# Patient Record
Sex: Female | Born: 1970 | Hispanic: Yes | Marital: Married | Smoking: Never smoker
Health system: Southern US, Community
[De-identification: ages and names within clinical notes are randomized; demographics above are authoritative.]

## PROBLEM LIST (undated history)

## (undated) DIAGNOSIS — Z915 Personal history of self-harm: Secondary | ICD-10-CM

## (undated) DIAGNOSIS — I10 Essential (primary) hypertension: Secondary | ICD-10-CM

## (undated) DIAGNOSIS — Z9151 Personal history of suicidal behavior: Secondary | ICD-10-CM

---

## 2015-05-21 ENCOUNTER — Encounter (HOSPITAL_COMMUNITY): Payer: Self-pay | Admitting: Nurse Practitioner

## 2015-05-21 ENCOUNTER — Encounter (HOSPITAL_COMMUNITY): Payer: Self-pay | Admitting: *Deleted

## 2015-05-21 ENCOUNTER — Emergency Department (HOSPITAL_COMMUNITY)
Admission: EM | Admit: 2015-05-21 | Discharge: 2015-05-21 | Disposition: A | Discharge status: Other Acute Inpatient Hospital | Payer: BLUE CROSS/BLUE SHIELD | Attending: Emergency Medicine | Admitting: Emergency Medicine

## 2015-05-21 ENCOUNTER — Observation Stay (HOSPITAL_COMMUNITY)
Admission: AD | Admit: 2015-05-21 | Discharge: 2015-05-22 | Disposition: A | Discharge status: Home / Self Care | Payer: BLUE CROSS/BLUE SHIELD | Source: Intra-hospital | Attending: Psychiatry | Admitting: Psychiatry

## 2015-05-21 DIAGNOSIS — S61512A Laceration without foreign body of left wrist, initial encounter: Secondary | ICD-10-CM | POA: Insufficient documentation

## 2015-05-21 DIAGNOSIS — S1091XA Abrasion of unspecified part of neck, initial encounter: Secondary | ICD-10-CM | POA: Insufficient documentation

## 2015-05-21 DIAGNOSIS — Z880 Allergy status to penicillin: Secondary | ICD-10-CM | POA: Insufficient documentation

## 2015-05-21 DIAGNOSIS — F329 Major depressive disorder, single episode, unspecified: Secondary | ICD-10-CM | POA: Insufficient documentation

## 2015-05-21 DIAGNOSIS — F321 Major depressive disorder, single episode, moderate: Secondary | ICD-10-CM | POA: Diagnosis present

## 2015-05-21 DIAGNOSIS — I10 Essential (primary) hypertension: Secondary | ICD-10-CM | POA: Insufficient documentation

## 2015-05-21 DIAGNOSIS — F1410 Cocaine abuse, uncomplicated: Secondary | ICD-10-CM | POA: Insufficient documentation

## 2015-05-21 DIAGNOSIS — X788XXA Intentional self-harm by other sharp object, initial encounter: Secondary | ICD-10-CM | POA: Diagnosis not present

## 2015-05-21 DIAGNOSIS — R45851 Suicidal ideations: Secondary | ICD-10-CM

## 2015-05-21 DIAGNOSIS — Y9389 Activity, other specified: Secondary | ICD-10-CM | POA: Diagnosis not present

## 2015-05-21 DIAGNOSIS — Z3202 Encounter for pregnancy test, result negative: Secondary | ICD-10-CM | POA: Insufficient documentation

## 2015-05-21 DIAGNOSIS — Y9289 Other specified places as the place of occurrence of the external cause: Secondary | ICD-10-CM | POA: Insufficient documentation

## 2015-05-21 DIAGNOSIS — Y998 Other external cause status: Secondary | ICD-10-CM | POA: Insufficient documentation

## 2015-05-21 DIAGNOSIS — F411 Generalized anxiety disorder: Secondary | ICD-10-CM | POA: Diagnosis not present

## 2015-05-21 DIAGNOSIS — F332 Major depressive disorder, recurrent severe without psychotic features: Secondary | ICD-10-CM | POA: Diagnosis present

## 2015-05-21 HISTORY — DX: Personal history of self-harm: Z91.5

## 2015-05-21 HISTORY — DX: Personal history of suicidal behavior: Z91.51

## 2015-05-21 HISTORY — DX: Essential (primary) hypertension: I10

## 2015-05-21 LAB — COMPREHENSIVE METABOLIC PANEL
ALT: 18 U/L (ref 14–54)
AST: 22 U/L (ref 15–41)
Albumin: 3.7 g/dL (ref 3.5–5.0)
Alkaline Phosphatase: 68 U/L (ref 38–126)
Anion gap: 15 (ref 5–15)
BUN: 14 mg/dL (ref 6–20)
CHLORIDE: 106 mmol/L (ref 101–111)
CO2: 19 mmol/L — ABNORMAL LOW (ref 22–32)
Calcium: 9.7 mg/dL (ref 8.9–10.3)
Creatinine, Ser: 0.69 mg/dL (ref 0.44–1.00)
GFR calc non Af Amer: >60 mL/min (ref >60)
Glucose, Bld: 108 mg/dL — ABNORMAL HIGH (ref 65–99)
POTASSIUM: 4.2 mmol/L (ref 3.5–5.1)
SODIUM: 140 mmol/L (ref 135–145)
Total Bilirubin: 0.5 mg/dL (ref 0.3–1.2)
Total Protein: 6.8 g/dL (ref 6.5–8.1)

## 2015-05-21 LAB — RAPID URINE DRUG SCREEN, HOSP PERFORMED
AMPHETAMINES: NOT DETECTED
BENZODIAZEPINES: NOT DETECTED
Barbiturates: NOT DETECTED
COCAINE: POSITIVE — AB
OPIATES: NOT DETECTED
Tetrahydrocannabinol: NOT DETECTED

## 2015-05-21 LAB — CBC
HCT: 33.5 % — ABNORMAL LOW (ref 36.0–46.0)
Hemoglobin: 9.7 g/dL — ABNORMAL LOW (ref 12.0–15.0)
MCH: 19.5 pg — ABNORMAL LOW (ref 26.0–34.0)
MCHC: 29 g/dL — ABNORMAL LOW (ref 30.0–36.0)
MCV: 67.3 fL — ABNORMAL LOW (ref 78.0–100.0)
PLATELETS: 503 10*3/uL — AB (ref 150–400)
RBC: 4.98 MIL/uL (ref 3.87–5.11)
RDW: 18.8 % — ABNORMAL HIGH (ref 11.5–15.5)
WBC: 9 10*3/uL (ref 4.0–10.5)

## 2015-05-21 LAB — I-STAT BETA HCG BLOOD, ED (MC, WL, AP ONLY): I-stat hCG, quantitative: <5 m[IU]/mL (ref <5)

## 2015-05-21 LAB — ACETAMINOPHEN LEVEL

## 2015-05-21 LAB — SALICYLATE LEVEL

## 2015-05-21 LAB — ETHANOL

## 2015-05-21 MED ORDER — HYDROXYZINE HCL 25 MG PO TABS
25.0000 mg | ORAL_TABLET | Freq: Three times a day (TID) | ORAL | Status: DC | PRN
  Administered 2015-05-21: 25 mg via ORAL
  Filled 2015-05-21: qty 1

## 2015-05-21 MED ORDER — HYDROCHLOROTHIAZIDE 25 MG PO TABS
25.0000 mg | ORAL_TABLET | Freq: Every day | ORAL | Status: DC
  Administered 2015-05-21: 25 mg via ORAL
  Filled 2015-05-21: qty 1

## 2015-05-21 MED ORDER — TRAZODONE HCL 50 MG PO TABS
50.0000 mg | ORAL_TABLET | Freq: Every evening | ORAL | Status: DC | PRN
  Administered 2015-05-21: 50 mg via ORAL
  Filled 2015-05-21: qty 1

## 2015-05-21 MED ORDER — ZOLPIDEM TARTRATE 5 MG PO TABS: 5.0000 mg | ORAL_TABLET | Freq: Every evening | ORAL | Status: DC | PRN

## 2015-05-21 MED ORDER — ALUM & MAG HYDROXIDE-SIMETH 200-200-20 MG/5ML PO SUSP: 30.0000 mL | ORAL | Status: DC | PRN

## 2015-05-21 MED ORDER — ACETAMINOPHEN 325 MG PO TABS: 650.0000 mg | ORAL_TABLET | ORAL | Status: DC | PRN

## 2015-05-21 MED ORDER — LORAZEPAM 1 MG PO TABS: 1.0000 mg | ORAL_TABLET | Freq: Three times a day (TID) | ORAL | Status: DC | PRN

## 2015-05-21 MED ORDER — IBUPROFEN 400 MG PO TABS: 600.0000 mg | ORAL_TABLET | Freq: Three times a day (TID) | ORAL | Status: DC | PRN

## 2015-05-21 MED ORDER — ACETAMINOPHEN 325 MG PO TABS: 650.0000 mg | ORAL_TABLET | Freq: Four times a day (QID) | ORAL | Status: DC | PRN

## 2015-05-21 MED ORDER — HYDROCHLOROTHIAZIDE 25 MG PO TABS
25.0000 mg | ORAL_TABLET | Freq: Every day | ORAL | Status: DC
  Administered 2015-05-22: 25 mg via ORAL
  Filled 2015-05-21: qty 1

## 2015-05-21 MED ORDER — ONDANSETRON HCL 4 MG PO TABS: 4.0000 mg | ORAL_TABLET | Freq: Three times a day (TID) | ORAL | Status: DC | PRN

## 2015-05-21 MED ORDER — MAGNESIUM HYDROXIDE 400 MG/5ML PO SUSP: 30.0000 mL | Freq: Every day | ORAL | Status: DC | PRN

## 2015-05-21 NOTE — ED Notes (Signed)
Paige, [facility] - advised interpretor for pt should be arriving in approx 45 min.

## 2015-05-21 NOTE — ED Notes (Signed)
She reports 1 month history of depression and suicidal thoughts. She has a plan to hurt herself which she did not tell me. She denies HI or thoughts of harming others. She reports this all started when she began to have marital problems 1 month ago. She denies any past history of depression or suicidality. She reports occasional alcohol use but denies any heavy drinking or other substance use.

## 2015-05-21 NOTE — ED Notes (Addendum)
Pt ambulatory to C21 w/daughter. Pt wearing maroon-colored paper scrubs. Pt noted to be tearful. Min English speaking. Pt and daughter aware [facility] will perform TTS when interpretor arrives. Both voiced understanding of Pod C policies. Pt given crackers, graham crackers, peanut butter and water as requested d/t does not like food on meal tray. Staffing Office aware pt is now in C21.

## 2015-05-21 NOTE — ED Notes (Addendum)
TC from [place], interpreter. She will get to [facility] in approx 45 mins. Writer notified pt's Child psychotherapist.  Evette Cristal, [place] Therapeutic Triage Specialist [phone]   Writer spoke w/ Darel Hong at [employer] [phone]. Writer requests a Spanish interpreter to be present at bedside when Multimedia programmer. Darel Hong will call writer back when interpreter on way to [facility] ED. Writer notified pt's RN [place] [phone]  Evette Cristal, [place] Therapeutic Triage Specialist [phone]
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
## 2015-05-21 NOTE — ED Notes (Signed)
Notified Public house manager. Notified Security for [employer]

## 2015-05-21 NOTE — ED Provider Notes (Signed)
Accepted to [facility] by Dr. Lucianne Muss. Stable and medically cleared for transfer.  Lavera Guise, MD 05/21/15 [phone]
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
## 2015-05-21 NOTE — ED Notes (Signed)
Pt accepted to [facility] OBS unit. Per Darel Hong at [employer], they don't have contract with [facility] so can't provide service for pt at OBS. TTS will need to contact [employer] in am.   Evette Cristal, [place] Therapeutic Triage Specialist

## 2015-05-21 NOTE — ED Notes (Signed)
Pt and daughter lying on bed, alert. Daughter voiced understanding that she may stay until TTS is completed and disposition is set.

## 2015-05-21 NOTE — ED Notes (Signed)
Pt's daughter Brigid Re [phone]. Please keep daughter informed when pt is to leave OBS as she is pt's ride.  Evette Cristal, [place] Therapeutic Triage Specialist
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
## 2015-05-21 NOTE — ED Notes (Signed)
Dinner ordered for pt

## 2015-05-21 NOTE — ED Notes (Signed)
TTS completed. RN spoke w/pt via Carie Caddy. Pt voiced understanding will wait for disposition from [facility]. Pt advised she is concerned d/t was advised she was positive for cocaine in her UDS and states she has not ever used any drugs. 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 Pt's daughter at bedside.

## 2015-05-21 NOTE — ED Notes (Signed)
Per Idalia Needle, [facility] - pt accepted to [facility] Obs - Dr Lucianne Muss - may call report/transport pt at 1930. Paige speaking w/pt's daughter.

## 2015-05-21 NOTE — ED Notes (Signed)
Writer called again spoke w/ Darel Hong at [employer] [phone]. Writer asked if Darel Hong could find out how much longer it will take Nettie Elm to arrive at [facility] as Clinical research associate still waiting to conduct pt's teleassessment consult.   Evette Cristal, [place] Therapeutic Triage Specialist

## 2015-05-21 NOTE — ED Notes (Signed)
Copy of consent forms faxed to [facility], copy sent to medical records and original to [facility].

## 2015-05-21 NOTE — ED Notes (Signed)
Pt signing consent forms - daughter assisting w/interpreting per pt's request - offered to call for interpretor - declined - advised her daughter can assist.

## 2015-05-22 DIAGNOSIS — F332 Major depressive disorder, recurrent severe without psychotic features: Secondary | ICD-10-CM | POA: Diagnosis present

## 2015-05-22 DIAGNOSIS — F321 Major depressive disorder, single episode, moderate: Secondary | ICD-10-CM | POA: Diagnosis present

## 2015-05-22 MED ORDER — SERTRALINE HCL 50 MG PO TABS: 50.0000 mg | ORAL_TABLET | Freq: Every day | ORAL | Status: AC

## 2015-05-22 MED ORDER — HYDROXYZINE HCL 25 MG PO TABS: 25.0000 mg | ORAL_TABLET | Freq: Three times a day (TID) | ORAL | Status: DC | PRN

## 2015-05-22 MED ORDER — SERTRALINE HCL 50 MG PO TABS
50.0000 mg | ORAL_TABLET | Freq: Every day | ORAL | Status: DC
  Administered 2015-05-22: 50 mg via ORAL
  Filled 2015-05-22: qty 1

## 2015-05-22 MED ORDER — TRAZODONE HCL 50 MG PO TABS: 50.0000 mg | ORAL_TABLET | Freq: Every evening | ORAL | Status: AC | PRN

## 2015-05-22 NOTE — BH Assessment (Addendum)
[facility] Assessment Progress Note  Patient was seen this date by Porfirio Oar and Earlene Plater NP. This Clinical research associate utilized a Research officer, trade union to assist with patient's treatment plan/concerns. Patient stated that she was still feeling "sad" this date but denied any S/I. Collateral was gathered from patient's daughter who stated that her mother and father had been having marital problems for the last two weeks with patient becoming upset last night and made statements that she may harm herself. Patient through interpreter, stated she would like to return to her residence this date and was just "upset" last night. Patient did report increased stress and would be interested in starting on some medications to assist with anxiety and depression. Patient per Earlene Plater NP will be started on medication/s and monitored in the Observational Unit. Patient will be re-evaluated later this date to determine disposition. This Clinical research associate contacted the [employer] to assist patient with Spanish referral services/counseling on discharge. Patient is to contact [phone]. [facility] [place] also offers services to individuals that are bi-lingual (Spanish) and will assist with follow up for medication management.

## 2015-05-22 NOTE — Progress Notes (Signed)
[place] Unit Progress Note  05/22/2015 4:24 PM Jillian Maldonado  MRN:  [id number] Subjective:    Patient states "I am still very sad. Not wanting to hurt myself right now. I am having relationship problems right now."  Objective:  Patient seen and chart is reviewed. The patient reports ongoing depressive symptoms and stressors surrounding relationship with husband. Her daughter reported that the patient has not had previous psychiatric treatment. The patient reported on admission that she had recently told her husband something about her past which resulted in him acting differently towards her. This coincides with her recent problems with depression and suicidal thoughts. Patient continues to deny any psychotic symptoms or past manic episodes. She is motivated to pursue counseling and open to trying medication to target her current symptoms.   Principal Problem: MDD (major depressive disorder), single episode, moderate ([place]) Diagnosis:   Patient Active Problem List   Diagnosis Date Noted  . MDD (major depressive disorder), single episode, moderate ([place]) [F32.1] 05/22/2015   Total Time spent with patient: 30 minutes  Past Psychiatric History: Depression  Past Medical History:  Past Medical History  Diagnosis Date  . Hypertension   . H/O suicide attempt    History reviewed. No pertinent past surgical history. Family History: History reviewed. No pertinent family history. Family Psychiatric  History: None reported  Social History:  History  Alcohol Use  . Yes     History  Drug Use No    Social History   Social History  . Marital Status: Married    Spouse Name: N/A  . Number of Children: N/A  . Years of Education: N/A   Social History Main Topics  . Smoking status: Never Smoker   . Smokeless tobacco: None  . Alcohol Use: Yes  . Drug Use: No  . Sexual Activity: Not Asked   Other Topics Concern  . None   Social History Narrative   Additional Social History:                          Sleep: Fair  Appetite:  Fair  Current Medications: Current Facility-Administered Medications  Medication Dose Route Frequency Provider Last Rate Last Dose  . acetaminophen (TYLENOL) tablet 650 mg  650 mg Oral Q6H PRN Harriet Butte, NP      . alum & mag hydroxide-simeth (MAALOX/MYLANTA) 200-200-20 MG/5ML suspension 30 mL  30 mL Oral Q4H PRN Harriet Butte, NP      . hydrochlorothiazide (HYDRODIURIL) tablet 25 mg  25 mg Oral Daily Harriet Butte, NP   25 mg at 05/22/15 0744  . hydrOXYzine (ATARAX/VISTARIL) tablet 25 mg  25 mg Oral TID PRN Harriet Butte, NP   25 mg at 05/21/15 2216  . magnesium hydroxide (MILK OF MAGNESIA) suspension 30 mL  30 mL Oral Daily PRN Harriet Butte, NP      . sertraline (ZOLOFT) tablet 50 mg  50 mg Oral Daily Niel Hummer, NP   50 mg at 05/22/15 1200  . traZODone (DESYREL) tablet 50 mg  50 mg Oral QHS PRN Harriet Butte, NP   50 mg at 05/21/15 2216    Lab Results:  Results for orders placed or performed during the hospital encounter of 05/21/15 (from the past 48 hour(s))  Comprehensive metabolic panel     Status: Abnormal   Collection Time: 05/21/15 12:40 PM  Result Value Ref Range   Sodium 140 135 - 145 mmol/L   Potassium  4.2 3.5 - 5.1 mmol/L   Chloride 106 101 - 111 mmol/L   CO2 19 (L) 22 - 32 mmol/L   Glucose, Bld 108 (H) 65 - 99 mg/dL   BUN 14 6 - 20 mg/dL   Creatinine, Ser 0.69 0.44 - 1.00 mg/dL   Calcium 9.7 8.9 - 10.3 mg/dL   Total Protein 6.8 6.5 - 8.1 g/dL   Albumin 3.7 3.5 - 5.0 g/dL   AST 22 15 - 41 U/L   ALT 18 14 - 54 U/L   Alkaline Phosphatase 68 38 - 126 U/L   Total Bilirubin 0.5 0.3 - 1.2 mg/dL   GFR calc non Af Amer >60 >60 mL/min   GFR calc Af Amer >60 >60 mL/min    Comment: (NOTE) The eGFR has been calculated using the CKD EPI equation. This calculation has not been validated in all clinical situations. eGFR's persistently <60 mL/min signify possible Chronic Kidney Disease.    Anion  gap 15 5 - 15  Ethanol (ETOH)     Status: None   Collection Time: 05/21/15 12:40 PM  Result Value Ref Range   Alcohol, Ethyl (B) <5 <5 mg/dL    Comment:        LOWEST DETECTABLE LIMIT FOR SERUM ALCOHOL IS 5 mg/dL FOR MEDICAL PURPOSES ONLY   Salicylate level     Status: None   Collection Time: 05/21/15 12:40 PM  Result Value Ref Range   Salicylate Lvl <8.6 2.8 - 30.0 mg/dL  Acetaminophen level     Status: Abnormal   Collection Time: 05/21/15 12:40 PM  Result Value Ref Range   Acetaminophen (Tylenol), Serum <10 (L) 10 - 30 ug/mL    Comment:        THERAPEUTIC CONCENTRATIONS VARY SIGNIFICANTLY. A RANGE OF 10-30 ug/mL MAY BE AN EFFECTIVE CONCENTRATION FOR MANY PATIENTS. HOWEVER, SOME ARE BEST TREATED AT CONCENTRATIONS OUTSIDE THIS RANGE. ACETAMINOPHEN CONCENTRATIONS >150 ug/mL AT 4 HOURS AFTER INGESTION AND >50 ug/mL AT 12 HOURS AFTER INGESTION ARE OFTEN ASSOCIATED WITH TOXIC REACTIONS.   CBC     Status: Abnormal   Collection Time: 05/21/15 12:40 PM  Result Value Ref Range   WBC 9.0 4.0 - 10.5 K/uL   RBC 4.98 3.87 - 5.11 MIL/uL   Hemoglobin 9.7 (L) 12.0 - 15.0 g/dL   HCT 33.5 (L) 36.0 - 46.0 %   MCV 67.3 (L) 78.0 - 100.0 fL   MCH 19.5 (L) 26.0 - 34.0 pg   MCHC 29.0 (L) 30.0 - 36.0 g/dL   RDW 18.8 (H) 11.5 - 15.5 %   Platelets 503 (H) 150 - 400 K/uL  I-Stat beta hCG blood, ED ([facility], [facility], [facility] only)     Status: None   Collection Time: 05/21/15 12:51 PM  Result Value Ref Range   I-stat hCG, quantitative <5.0 <5 mIU/mL   Comment 3            Comment:   GEST. AGE      CONC.  (mIU/mL)   <=1 WEEK        5 - 50     2 WEEKS       50 - 500     3 WEEKS       100 - 10,000     4 WEEKS     1,000 - 30,000        FEMALE AND NON-PREGNANT FEMALE:     LESS THAN 5 mIU/mL   Urine rapid drug screen (hosp performed) (Not at [facility])  Status: Abnormal   Collection Time: 05/21/15  1:40 PM  Result Value Ref Range   Opiates NONE DETECTED NONE DETECTED   Cocaine POSITIVE (A) NONE DETECTED    Benzodiazepines NONE DETECTED NONE DETECTED   Amphetamines NONE DETECTED NONE DETECTED   Tetrahydrocannabinol NONE DETECTED NONE DETECTED   Barbiturates NONE DETECTED NONE DETECTED    Comment:        DRUG SCREEN FOR MEDICAL PURPOSES ONLY.  IF CONFIRMATION IS NEEDED FOR ANY PURPOSE, NOTIFY LAB WITHIN 5 DAYS.        LOWEST DETECTABLE LIMITS FOR URINE DRUG SCREEN Drug Class       Cutoff (ng/mL) Amphetamine      1000 Barbiturate      200 Benzodiazepine   825 Tricyclics       053 Opiates          300 Cocaine          300 THC              50     Blood Alcohol level:  Lab Results  Component Value Date   ETH <5 05/21/2015    Physical Findings: AIMS:  , ,  ,  ,    CIWA:    COWS:     Musculoskeletal: Strength & Muscle Tone: within normal limits Gait & Station: normal Patient leans: N/A  Psychiatric Specialty Exam: Review of Systems  Constitutional: Negative.   HENT: Negative.   Eyes: Negative.   Respiratory: Negative.   Cardiovascular: Negative.   Gastrointestinal: Negative.   Genitourinary: Negative.   Musculoskeletal: Negative.   Skin: Negative.   Neurological: Negative.   Endo/Heme/Allergies: Negative.   Psychiatric/Behavioral: Positive for depression. Negative for suicidal ideas, hallucinations, memory loss and substance abuse. The patient is nervous/anxious and has insomnia.     Blood pressure 141/84, pulse 103, temperature 98.5 F (36.9 C), temperature source Oral, resp. rate 18, height '4\' 11"'  (1.499 m), weight 68.493 kg (151 lb), SpO2 100 %.Body mass index is 30.48 kg/(m^2).  General Appearance: Casual  Eye Contact::  Fair  Speech:  Clear and Coherent  Volume:  Decreased  Mood:  Depressed  Affect:  Constricted  Thought Process:  Goal Directed and Intact  Orientation:  Full (Time, Place, and Person)  Thought Content:  Symptoms, worries, concerns  Suicidal Thoughts:  No  Homicidal Thoughts:  No  Memory:  Immediate;   Good Recent;   Good Remote;   Good   Judgement:  Fair  Insight:  Fair  Psychomotor Activity:  Normal  Concentration:  Good  Recall:  Good  Fund of Knowledge:Good  Language: Good  Akathisia:  No  Handed:  Right  AIMS (if indicated):     Assets:  Communication Skills Desire for Improvement Financial Resources/Insurance Housing Intimacy Leisure Time Physical Health Resilience Social Support  ADL's:  Intact  Cognition: WNL  Sleep:      Treatment Plan Summary: Daily contact with patient to assess and evaluate symptoms and progress in treatment and Medication management  -Start Zoloft 50 mg daily for depressive symptoms -Continue Vistaril 25 mg TID prn anxiety -Continue Trazodone 50 mg hs prn insomnia  -Patient continues to deny active suicidal ideation and is requesting discharge with outpatient follow up that can accommodate Spanish speaking patients. Her daughter has been contacted for collateral who voices no current concerns regarding the patient's safety. The patient plans on staying with her daughter to avoid potential triggers from her husband.   Elmarie Shiley, NP 05/22/2015, 4:24 PM

## 2015-05-22 NOTE — Progress Notes (Signed)
Patient ID: Jillian Maldonado, female   DOB: 26-Feb-1971, 45 y.o.   MRN: [id number] Started on Zoloft per Charisse March NP for sx of depression and anxiety. Pleasant and verbal and has offered no complaints. Slept some of the shift and was given magazines to help pass the time. Offered to move her to another chair to be closer to TV and not right next to the only other patient in the room, but she declined saying she was "good here" She has had a good appetite while here. Vernona Rieger NP is planning on her discharging tomorrow for home.

## 2015-05-22 NOTE — BH Assessment (Signed)
[facility] Assessment Progress Note This Clinical research associate spoke with patient's daughter Jillian Maldonado [phone] who translated for her mother who has asked to be discharged this date. Patient's daughter stated that her mother could stay at her residence where she could be monitored and supported by her family. Patient's daughter continued to translate for her mother who stated she had no thoughts of self harm and would like to return to her daughter's residence. This Clinical research associate staffed case with Earlene Plater NP who agreed to discharge this date since patient denied any thoughts of self harm and will be monitored by her daughter staying at her residence.  Patient's daughter will pick patient up later this date and assist patient will follow up for outpatient treatment at [facility] the [place]. This Clinical research associate contacted Thyra Breed of Family services to obtain the information needed to provide to patient on discharge for follow up care. Patient currently denies any thoughts of self harm and feels she would be better supported at her daughter's residence. Patient has agreed to adhere to medication/s prescribed while at [facility] and will follow up with outpatient services at  [facility].

## 2015-05-22 NOTE — Discharge Instructions (Signed)
Patient will follow up with [facility] [place] for continued medication management and counseling.
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
## 2015-05-22 NOTE — Discharge Summary (Signed)
[facility]ation Unit Discharge Summary Note  Patient:  Jillian Maldonado is an 45 y.o., female MRN:  [id number] DOB:  02/11/1971 Patient phone:  [phone] (home)  Patient address:   [address] [place] [address],  Total Time spent with patient: 30 minutes  Date of Admission:  05/21/2015 Date of Discharge: 05/22/2015  Reason for Admission:  Depressive symptoms   Principal Problem: MDD (major depressive disorder), single episode, moderate (HCC) Discharge Diagnoses: Patient Active Problem List   Diagnosis Date Noted  . MDD (major depressive disorder), single episode, moderate (HCC) [F32.1] 05/22/2015   Past Psychiatric History: None  Past Medical History:  Past Medical History  Diagnosis Date  . Hypertension   . H/O suicide attempt    History reviewed. No pertinent past surgical history. Family History: History reviewed. No pertinent family history. Family Psychiatric  History: See H & P Social History:  History  Alcohol Use  . Yes     History  Drug Use No    Social History   Social History  . Marital Status: Married    Spouse Name: N/A  . Number of Children: N/A  . Years of Education: N/A   Social History Main Topics  . Smoking status: Never Smoker   . Smokeless tobacco: None  . Alcohol Use: Yes  . Drug Use: No  . Sexual Activity: Not Asked   Other Topics Concern  . None   Social History Narrative    Hospital Course:    Jillian Maldonado is an 45 y.o. female who presented voluntarily to the [facility]. Due to the patient speaking Spanish she required an interpreter for psychiatric assessments. The patient reported increased depression for the last month related to marital problems. Sharron reported telling her husband a secret from her past but that he now frequently brings it up. This has resulted in increased arguments between them. The patient began to have suicidal thoughts including a gesture of wrapping a belt around her neck. Her daughter is  very supportive of the patient. On admission the patient denied any past psychiatric assessment or previous suicide attempts. The patient has previously been seeking psychotherapy.   Patient was admitted to the [facility] unit where her symptoms were assessed and medications were initiated. She was started on Zoloft 50 mg daily for depressive symptoms, vistaril 25 mg as needed for anxiety, and Trazodone 50 mg at bedtime for insomnia. The patient denied any recent substance abuse. She does not know how cocaine could be found in her urine. During assessment with translator 05/22/2015 the patient denied any suicidal thoughts but continued to feel depressed. Patient was motivated to pursue therapy to help cope with the conflict regarding her husband. Her daughter felt comfortable with the patient staying at her home to give space from her husband. Patient tolerated with medications with no reported adverse effects. She was provided with a fourteen day supply of prescription for her medications. Her affect was noted to be much brighter by the time of discharge due to her smiling during interactions with Provider. Prior to her discharge from the Observation Unit the patient denied any active suicidal thoughts and her thinking appeared to be more future oriented. She left [facility] in stable condition with all belongings returned to her in no acute distress.   Physical Findings: AIMS:  , ,  ,  ,    CIWA:    COWS:     Musculoskeletal: Strength & Muscle Tone: within normal limits Gait & Station: normal Patient leans: N/A  Psychiatric Specialty Exam: Review of Systems  Constitutional: Negative.   HENT: Negative.   Eyes: Negative.   Respiratory: Negative.   Cardiovascular: Negative.   Gastrointestinal: Negative.   Genitourinary: Negative.   Musculoskeletal: Negative.   Skin: Negative.   Neurological: Negative.   Endo/Heme/Allergies: Negative.   Psychiatric/Behavioral: Positive for depression (Stable )  and substance abuse (Urine positive for cocaine but denies recent use ). Negative for suicidal ideas, hallucinations and memory loss. The patient is not nervous/anxious and does not have insomnia.     Blood pressure 141/84, pulse 103, temperature 98.5 F (36.9 C), temperature source Oral, resp. rate 18, height  (1.499 m), weight 68.493 kg (151 lb), SpO2 100 %.Body mass index is 30.48 kg/(m^2).  General Appearance: Casual  Eye Contact::  Good  Speech:  Clear and Coherent  Volume:  Normal  Mood:  Depressed  Affect:  Appropriate  Thought Process:  Goal Directed and Intact  Orientation:  Full (Time, Place, and Person)  Thought Content:  Symptoms, worries, concerns   Suicidal Thoughts:  No  Homicidal Thoughts:  No  Memory:  Immediate;   Good Recent;   Good Remote;   Good  Judgement:  Fair  Insight:  Present  Psychomotor Activity:  Normal  Concentration:  Good  Recall:  Good  Fund of Knowledge:Good  Language: Good  Akathisia:  No  Handed:  Right  AIMS (if indicated):     Assets:  Communication Skills Desire for Improvement Financial Resources/Insurance Housing Leisure Time Physical Health Resilience Social Support  ADL's:  Intact  Cognition: WNL  Sleep:         Has this patient used any form of tobacco in the last 30 days? (Cigarettes, Smokeless Tobacco, Cigars, and/or Pipes)  No  Blood Alcohol level:  Lab Results  Component Value Date   ETH <5 05/21/2015    Metabolic Disorder Labs:  No results found for: HGBA1C, MPG No results found for: PROLACTIN No results found for: CHOL, TRIG, HDL, CHOLHDL, VLDL, LDLCALC  Discharge destination:  Home  Is patient on multiple antipsychotic therapies at discharge:  No   Has Patient had three or more failed trials of antipsychotic monotherapy by history:  No  Recommended Plan for Multiple Antipsychotic Therapies: NA     Medication List    TAKE these medications      Indication   hydrochlorothiazide 25 MG tablet   Commonly known as:  HYDRODIURIL  Take 25 mg by mouth daily. for high blood pressure      hydrOXYzine 25 MG tablet  Commonly known as:  ATARAX/VISTARIL  Take 1 tablet (25 mg total) by mouth 3 (three) times daily as needed for anxiety (sleep).   Indication:  Anxiety Neurosis     sertraline 50 MG tablet  Commonly known as:  ZOLOFT  Take 1 tablet (50 mg total) by mouth daily.   Indication:  Major Depressive Disorder     traZODone 50 MG tablet  Commonly known as:  DESYREL  Take 1 tablet (50 mg total) by mouth at bedtime as needed for sleep (May repeat x1).   Indication:  Trouble Sleeping        Follow-up recommendations:   As above   Comments:   Take all your medications as prescribed by your mental healthcare provider.  Report any adverse effects and or reactions from your medicines to your outpatient provider promptly.  Patient is instructed and cautioned to not engage in alcohol and or illegal drug use while on prescription  medicines.  In the event of worsening symptoms, patient is instructed to call the crisis hotline, 911 and or go to the nearest ED for appropriate evaluation and treatment of symptoms.  Follow-up with your primary care provider for your other medical issues, concerns and or health care needs.   SignedFransisca Kaufmann, NP 05/22/2015, 5:02 PM

## 2017-07-17 ENCOUNTER — Emergency Department
Admission: EM | Admit: 2017-07-17 | Discharge: 2017-07-17 | Disposition: A | Discharge status: Home / Self Care | Payer: BLUE CROSS/BLUE SHIELD | Attending: Emergency Medicine | Admitting: Emergency Medicine

## 2017-07-17 ENCOUNTER — Encounter: Payer: Self-pay | Admitting: Emergency Medicine

## 2017-07-17 ENCOUNTER — Emergency Department: Payer: BLUE CROSS/BLUE SHIELD

## 2017-07-17 DIAGNOSIS — R109 Unspecified abdominal pain: Secondary | ICD-10-CM

## 2017-07-17 DIAGNOSIS — Z79899 Other long term (current) drug therapy: Secondary | ICD-10-CM | POA: Diagnosis not present

## 2017-07-17 DIAGNOSIS — R1110 Vomiting, unspecified: Secondary | ICD-10-CM | POA: Insufficient documentation

## 2017-07-17 DIAGNOSIS — R1011 Right upper quadrant pain: Secondary | ICD-10-CM | POA: Insufficient documentation

## 2017-07-17 LAB — COMPREHENSIVE METABOLIC PANEL
ALBUMIN: 4 g/dL (ref 3.5–5.0)
ALK PHOS: 83 U/L (ref 38–126)
ALT: 18 U/L (ref 14–54)
ANION GAP: 8 (ref 5–15)
AST: 21 U/L (ref 15–41)
BUN: 13 mg/dL (ref 6–20)
CALCIUM: 9.8 mg/dL (ref 8.9–10.3)
CO2: 29 mmol/L (ref 22–32)
Chloride: 98 mmol/L — ABNORMAL LOW (ref 101–111)
Creatinine, Ser: 0.56 mg/dL (ref 0.44–1.00)
GFR calc Af Amer: >60 mL/min (ref >60)
GFR calc non Af Amer: >60 mL/min (ref >60)
GLUCOSE: 112 mg/dL — AB (ref 65–99)
POTASSIUM: 3.9 mmol/L (ref 3.5–5.1)
Sodium: 135 mmol/L (ref 135–145)
Total Bilirubin: 0.6 mg/dL (ref 0.3–1.2)
Total Protein: 7.6 g/dL (ref 6.5–8.1)

## 2017-07-17 LAB — CBC
HEMATOCRIT: 37.5 % (ref 35.0–47.0)
HEMOGLOBIN: 13.1 g/dL (ref 12.0–16.0)
MCH: 27.1 pg (ref 26.0–34.0)
MCHC: 34.8 g/dL (ref 32.0–36.0)
MCV: 77.7 fL — ABNORMAL LOW (ref 80.0–100.0)
Platelets: 382 10*3/uL (ref 150–440)
RBC: 4.83 MIL/uL (ref 3.80–5.20)
RDW: 16.4 % — ABNORMAL HIGH (ref 11.5–14.5)
WBC: 13.3 10*3/uL — ABNORMAL HIGH (ref 3.6–11.0)

## 2017-07-17 LAB — URINALYSIS, COMPLETE (UACMP) WITH MICROSCOPIC
Bilirubin Urine: NEGATIVE
Glucose, UA: NEGATIVE mg/dL
Hgb urine dipstick: NEGATIVE
Ketones, ur: NEGATIVE mg/dL
Leukocytes, UA: NEGATIVE
Nitrite: NEGATIVE
Protein, ur: NEGATIVE mg/dL
RBC / HPF: NONE SEEN RBC/hpf (ref 0–5)
SPECIFIC GRAVITY, URINE: 1.006 (ref 1.005–1.030)
WBC UA: NONE SEEN WBC/hpf (ref 0–5)
pH: 7 (ref 5.0–8.0)

## 2017-07-17 LAB — TROPONIN I

## 2017-07-17 LAB — POC URINE PREG, ED: PREG TEST UR: NEGATIVE

## 2017-07-17 LAB — LIPASE, BLOOD: Lipase: 24 U/L (ref 11–51)

## 2017-07-17 MED ORDER — FAMOTIDINE 20 MG PO TABS: 20.0000 mg | ORAL_TABLET | Freq: Two times a day (BID) | ORAL | 0 refills | Status: AC

## 2017-07-17 MED ORDER — MORPHINE SULFATE (PF) 4 MG/ML IV SOLN
4.0000 mg | Freq: Once | INTRAVENOUS | Status: AC
  Administered 2017-07-17: 4 mg via INTRAVENOUS
  Filled 2017-07-17: qty 1

## 2017-07-17 MED ORDER — ONDANSETRON HCL 4 MG/2ML IJ SOLN
4.0000 mg | Freq: Once | INTRAMUSCULAR | Status: AC
  Administered 2017-07-17: 4 mg via INTRAVENOUS
  Filled 2017-07-17: qty 2

## 2017-07-17 MED ORDER — SODIUM CHLORIDE 0.9 % IV BOLUS
500.0000 mL | Freq: Once | INTRAVENOUS | Status: AC
  Administered 2017-07-17: 500 mL via INTRAVENOUS

## 2017-07-17 MED ORDER — IOPAMIDOL (ISOVUE-300) INJECTION 61%
100.0000 mL | Freq: Once | INTRAVENOUS | Status: AC | PRN
  Administered 2017-07-17: 100 mL via INTRAVENOUS
  Filled 2017-07-17: qty 100

## 2017-07-17 MED ORDER — ONDANSETRON HCL 4 MG PO TABS: 4.0000 mg | ORAL_TABLET | Freq: Three times a day (TID) | ORAL | 0 refills | Status: DC | PRN

## 2017-07-17 NOTE — ED Triage Notes (Signed)
Pt comes into the ED via POV c/o upper abdominal pain that radiates into her back between her shoulder blades.  Patient states this has been ongoing for a couple of days and has gotten worse.  Mild chest pain associated with it.  Patient states she has had N/V but denies any diarrhea.  Patient denies any shortness of breath but has had some mild dizziness.  Patient in NAD at this time with even and unlabored respirations.  Denies any cardiac history but states she has problems with reflux.  Patient still has appendix and gallbladder. Patient states that the pain is worse when she eats food.

## 2017-07-17 NOTE — ED Provider Notes (Addendum)
[facility] Emergency Department Provider Note  ____________________________________________   I have reviewed the triage vital signs and the nursing notes. Where available I have reviewed prior notes and, if possible and indicated, outside hospital notes.    HISTORY  Chief Complaint Abdominal Pain and Back Pain    HPI Jillian Maldonado is a 47 y.o. female who is healthy aside from hypertension, and history of depression in the past, presents today with right upper quadrant abdominal pain which is been there for 3 days.  Has had "a little" vomiting.  Pain is worse with food radiates to her back, nothing else makes it better nothing else makes it worse.  She denies any fever chills no change in stooling, she denies exertional symptoms or chest pain, the pain is exactly in the right upper quadrant.  She has had this multiple times before but this is the longest is ever lasted.  She denies any prior treatment.  It is a significant aching discomfort.     Past Medical History:  Diagnosis Date  . H/O suicide attempt   . Hypertension     Patient Active Problem List   Diagnosis Date Noted  . MDD (major depressive disorder), single episode, moderate (HCC) 05/22/2015    History reviewed. No pertinent surgical history.  Prior to Admission medications   Medication Sig Start Date End Date Taking? Authorizing Provider  hydrochlorothiazide (HYDRODIURIL) 25 MG tablet Take 25 mg by mouth daily. for high blood pressure 05/09/15   [provider]  hydrOXYzine (ATARAX/VISTARIL) 25 MG tablet Take 1 tablet (25 mg total) by mouth 3 (three) times daily as needed for anxiety (sleep). 05/22/15   Thermon Leyland, NP  sertraline (ZOLOFT) 50 MG tablet Take 1 tablet (50 mg total) by mouth daily. 05/22/15   Thermon Leyland, NP  traZODone (DESYREL) 50 MG tablet Take 1 tablet (50 mg total) by mouth at bedtime as needed for sleep (May repeat x1). 05/22/15   Thermon Leyland, NP     Allergies Penicillins  No family history on file.  Social History Social History   Tobacco Use  . Smoking status: Never Smoker  . Smokeless tobacco: Never Used  Substance Use Topics  . Alcohol use: Yes  . Drug use: No    Review of Systems Constitutional: No fever/chills Eyes: No visual changes. ENT: No sore throat. No stiff neck no neck pain Cardiovascular: Denies chest pain. Respiratory: Denies shortness of breath. Gastrointestinal:   no vomiting.  No diarrhea.  No constipation. Genitourinary: Negative for dysuria. Musculoskeletal: Negative lower extremity swelling Skin: Negative for rash. Neurological: Negative for severe headaches, focal weakness or numbness.   ____________________________________________   PHYSICAL EXAM:  VITAL SIGNS: ED Triage Vitals [07/17/17 1759]  Enc Vitals Group     BP 139/61     Pulse Rate 93     Resp 18     Temp 98.4 F (36.9 C)     Temp Source Oral     SpO2 98 %     Weight 176 lb (79.8 kg)     Height 4\' 11"  (1.499 m)     Head Circumference      Peak Flow      Pain Score 8     Pain Loc      Pain Edu?      Excl. in GC?     Constitutional: Alert and oriented. Well appearing and in no acute distress. Eyes: Conjunctivae are normal Head: Atraumatic HEENT: No congestion/rhinnorhea. Mucous  membranes are moist.  Oropharynx non-erythematous Neck:   Nontender with no meningismus, no masses, no stridor Cardiovascular: Normal rate, regular rhythm. Grossly normal heart sounds.  Good peripheral circulation. Respiratory: Normal respiratory effort.  No retractions. Lungs CTAB. Abdominal: Soft and focal tenderness to palpation right upper quadrant, voluntary guarding.  No rebound, no distention Back:  There is no focal tenderness or step off.  there is no midline tenderness there are no lesions noted. there is no CVA tenderness Musculoskeletal: No lower extremity tenderness, no upper extremity tenderness. No joint effusions, no DVT  signs strong distal pulses no edema Neurologic:  Normal speech and language. No gross focal neurologic deficits are appreciated.  Skin:  Skin is warm, dry and intact. No rash noted. Psychiatric: Mood and affect are normal. Speech and behavior are normal.  ____________________________________________   LABS (all labs ordered are listed, but only abnormal results are displayed)  Labs Reviewed  COMPREHENSIVE METABOLIC PANEL - Abnormal; Notable for the following components:      Result Value   Chloride 98 (*)    Glucose, Bld 112 (*)    All other components within normal limits  CBC - Abnormal; Notable for the following components:   WBC 13.3 (*)    MCV 77.7 (*)    RDW 16.4 (*)    All other components within normal limits  URINALYSIS, COMPLETE (UACMP) WITH MICROSCOPIC - Abnormal; Notable for the following components:   Color, Urine STRAW (*)    APPearance CLEAR (*)    Bacteria, UA RARE (*)    Squamous Epithelial / LPF 0-5 (*)    All other components within normal limits  LIPASE, BLOOD  TROPONIN I  POC URINE PREG, ED    Pertinent labs  results that were available during my care of the patient were reviewed by me and considered in my medical decision making (see chart for details). ____________________________________________  EKG  I personally interpreted any EKGs ordered by me or triage Normal sinus rhythm rate 90 bpm no acute ST elevation or depression no specific ST changes normal axis ____________________________________________  RADIOLOGY  Pertinent labs & imaging results that were available during my care of the patient were reviewed by me and considered in my medical decision making (see chart for details). If possible, patient and/or family made aware of any abnormal findings.  No results found. ____________________________________________    PROCEDURES  Procedure(s) performed: None  Procedures  Critical Care performed:  None  ____________________________________________   INITIAL IMPRESSION / ASSESSMENT AND PLAN / ED COURSE  Pertinent labs & imaging results that were available during my care of the patient were reviewed by me and considered in my medical decision making (see chart for details).  Patient here with very reproducible right upper quadrant pain, worse with food, chronic and recurrent, most likely gallbladder disease, do not think this likely represents ACS PE or dissection.  Upon done by triage is reassuring, liver function tests are reassuring lipase is reassuring, white count is mildly elevated we will give her pain control nausea vacation IV fluid and obtain ultrasound and reassess  ----------------------------------------- 9:51 PM on 07/17/2017 -----------------------------------------  Abdomen benign at this time,  At this time, there does not appear to be clinical evidence to support the diagnosis of pulmonary embolus, dissection, myocarditis, endocarditis, pericarditis, pericardial tamponade, acute coronary syndrome, pneumothorax, pneumonia, or any other acute intrathoracic pathology that will require admission or acute intervention. Nor is there evidence of any significant intra-abdominal pathology causing this discomfort. Considering the patient's  symptoms, medical history, and physical examination today, I have low suspicion for cholecystitis or biliary pathology, pancreatitis, perforation or bowel obstruction, hernia, intra-abdominal abscess, AAA or dissection, volvulus or intussusception, mesenteric ischemia, PID, ovarian torsion or cyst etc. ischemic gut, pyelonephritis or appendicitis.     ____________________________________________   FINAL CLINICAL IMPRESSION(S) / ED DIAGNOSES  Final diagnoses:  Abdominal pain      This chart was dictated using voice recognition software.  Despite best efforts to proofread,  errors can occur which can change meaning.      Jeanmarie Plant, MD 07/17/17 Kristopher Oppenheim    Jeanmarie Plant, MD 07/17/17 2152

## 2018-02-14 ENCOUNTER — Other Ambulatory Visit: Payer: Self-pay

## 2018-02-14 ENCOUNTER — Encounter: Payer: Self-pay | Admitting: Emergency Medicine

## 2018-02-14 ENCOUNTER — Emergency Department
Admission: EM | Admit: 2018-02-14 | Discharge: 2018-02-14 | Disposition: A | Discharge status: Home / Self Care | Payer: BLUE CROSS/BLUE SHIELD | Attending: Emergency Medicine | Admitting: Emergency Medicine

## 2018-02-14 ENCOUNTER — Emergency Department: Payer: BLUE CROSS/BLUE SHIELD

## 2018-02-14 DIAGNOSIS — M1712 Unilateral primary osteoarthritis, left knee: Secondary | ICD-10-CM | POA: Diagnosis not present

## 2018-02-14 DIAGNOSIS — M25562 Pain in left knee: Secondary | ICD-10-CM | POA: Diagnosis present

## 2018-02-14 MED ORDER — TRAMADOL HCL 50 MG PO TABS
50.0000 mg | ORAL_TABLET | Freq: Once | ORAL | Status: AC
  Administered 2018-02-14: 50 mg via ORAL
  Filled 2018-02-14: qty 1

## 2018-02-14 MED ORDER — MELOXICAM 15 MG PO TABS: 15.0000 mg | ORAL_TABLET | Freq: Every day | ORAL | 0 refills | Status: DC

## 2018-02-14 MED ORDER — NAPROXEN 500 MG PO TABS
500.0000 mg | ORAL_TABLET | Freq: Once | ORAL | Status: AC
  Administered 2018-02-14: 500 mg via ORAL
  Filled 2018-02-14: qty 1

## 2018-02-14 MED ORDER — TRAMADOL HCL 50 MG PO TABS: 50.0000 mg | ORAL_TABLET | Freq: Four times a day (QID) | ORAL | 0 refills | Status: AC | PRN

## 2018-02-14 NOTE — Discharge Instructions (Addendum)
Follow discharge care instructions take medication as directed. °

## 2018-02-14 NOTE — ED Notes (Signed)
See triage note  Presents with pain to left knee  States pain started about 1 week ago w/o injury  Pain is mainly lateral and posterior  Having increased pain with bending her knee   Hx of knee problems in past approx 10 years ago

## 2018-02-14 NOTE — ED Triage Notes (Signed)
L knee pain for years per pt but worse this week. Increased pain with ambulation and movement.

## 2018-02-14 NOTE — ED Provider Notes (Signed)
[facility] Emergency Department Provider Note   ____________________________________________   First MD Initiated Contact with Patient 02/14/18 1453     (approximate)  I have reviewed the triage vital signs and the nursing notes.   HISTORY  Chief Complaint Knee Pain    HPI Jillian Maldonado is a 47 y.o. female patient complain of chronic left knee pain which has increased this past week before provocative incident.  Patient did pain increased with ambulation and flexion of the knee.  Patient rates pain as a 9/10.  Patient described the pain is "aching".  No palliative measures for complaint.  Past Medical History:  Diagnosis Date  . H/O suicide attempt   . Hypertension     Patient Active Problem List   Diagnosis Date Noted  . MDD (major depressive disorder), single episode, moderate (HCC) 05/22/2015    History reviewed. No pertinent surgical history.  Prior to Admission medications   Medication Sig Start Date End Date Taking? Authorizing Provider  famotidine (PEPCID) 20 MG tablet Take 1 tablet (20 mg total) by mouth 2 (two) times daily. 07/17/17 07/17/18  Jeanmarie Plant, MD  hydrochlorothiazide (HYDRODIURIL) 25 MG tablet Take 25 mg by mouth daily. for high blood pressure 05/09/15   [provider]  hydrOXYzine (ATARAX/VISTARIL) 25 MG tablet Take 1 tablet (25 mg total) by mouth 3 (three) times daily as needed for anxiety (sleep). 05/22/15   Thermon Leyland, NP  meloxicam (MOBIC) 15 MG tablet Take 1 tablet (15 mg total) by mouth daily. 02/14/18   Joni Reining, PA-C  ondansetron (ZOFRAN) 4 MG tablet Take 1 tablet (4 mg total) by mouth every 8 (eight) hours as needed for nausea or vomiting. 07/17/17   Jeanmarie Plant, MD  sertraline (ZOLOFT) 50 MG tablet Take 1 tablet (50 mg total) by mouth daily. 05/22/15   Thermon Leyland, NP  traMADol (ULTRAM) 50 MG tablet Take 1 tablet (50 mg total) by mouth every 6 (six) hours as needed. 02/14/18 02/14/19   Joni Reining, PA-C  traZODone (DESYREL) 50 MG tablet Take 1 tablet (50 mg total) by mouth at bedtime as needed for sleep (May repeat x1). 05/22/15   Thermon Leyland, NP    Allergies Penicillins  History reviewed. No pertinent family history.  Social History Social History   Tobacco Use  . Smoking status: Never Smoker  . Smokeless tobacco: Never Used  Substance Use Topics  . Alcohol use: Yes  . Drug use: No    Review of Systems Constitutional: No fever/chills Eyes: No visual changes. ENT: No sore throat. Cardiovascular: Denies chest pain. Respiratory: Denies shortness of breath. Gastrointestinal: No abdominal pain.  No nausea, no vomiting.  No diarrhea.  No constipation. Genitourinary: Negative for dysuria. Musculoskeletal: Chronic left knee pain. Skin: Negative for rash. Neurological: Negative for headaches, focal weakness or numbness. Allergic/Immunilogical: Penicillin ____________________________________________   PHYSICAL EXAM:  VITAL SIGNS: ED Triage Vitals  Enc Vitals Group     BP 02/14/18 1431 (!) 157/76     Pulse Rate 02/14/18 1431 74     Resp 02/14/18 1431 16     Temp 02/14/18 1431 97.8 F (36.6 C)     Temp Source 02/14/18 1431 Oral     SpO2 02/14/18 1431 97 %     Weight 02/14/18 1430 180 lb (81.6 kg)     Height 02/14/18 1430 4\' 11"  (1.499 m)     Head Circumference --      Peak Flow --  Pain Score 02/14/18 1430 9     Pain Loc --      Pain Edu? --      Excl. in GC? --    Constitutional: Alert and oriented. Well appearing and in no acute distress. Cardiovascular: Normal rate, regular rhythm. Grossly normal heart sounds.  Good peripheral circulation.  Elevated systolic blood pressure.   Respiratory: Normal respiratory effort.  No retractions. Lungs CTAB. Musculoskeletal: No lower extremity tenderness nor edema.  No joint effusions. Neurologic:  Normal speech and language. No gross focal neurologic deficits are appreciated. No gait  instability. Skin:  Skin is warm, dry and intact. No rash noted. Psychiatric: Mood and affect are normal. Speech and behavior are normal.  ____________________________________________   LABS (all labs ordered are listed, but only abnormal results are displayed)  Labs Reviewed - No data to display ____________________________________________  EKG   ____________________________________________  RADIOLOGY  ED MD interpretation:    Official radiology report(s): Dg Knee Complete 4 Views Left  Result Date: 02/14/2018 CLINICAL DATA:  Left knee pain. EXAM: LEFT KNEE - COMPLETE 4+ VIEW COMPARISON:  None. FINDINGS: Mild degenerative spurring noted in all 3 compartments. No fracture. No worrisome lytic or sclerotic osseous abnormality. No joint effusion. IMPRESSION: Mild degenerative changes without acute bony findings. Electronically Signed   By: Kennith CenterEric  Mansell M.D.   On: 02/14/2018 15:18    ____________________________________________   PROCEDURES  Procedure(s) performed:   Procedures  Critical Care performed: No  ____________________________________________   INITIAL IMPRESSION / ASSESSMENT AND PLAN / ED COURSE  As part of my medical decision making, I reviewed the following data within the electronic MEDICAL RECORD NUMBER    Chronic pain secondary to arthritis.  Discussed x-ray findings with patient.  Patient will follow-up with orthopedic further for evaluation treatment.  Patient given discharge care instructions and advised to take medication as directed.      ____________________________________________   FINAL CLINICAL IMPRESSION(S) / ED DIAGNOSES  Final diagnoses:  Primary osteoarthritis of left knee     ED Discharge Orders         Ordered    meloxicam (MOBIC) 15 MG tablet  Daily     02/14/18 1529    traMADol (ULTRAM) 50 MG tablet  Every 6 hours PRN     02/14/18 1529           Note:  This document was prepared using Dragon voice recognition software  and may include unintentional dictation errors.    Joni ReiningSmith, Roberta Angell K, PA-C 02/14/18 1536    Dionne BucySiadecki, Sebastian, MD 02/14/18 [phone]

## 2019-02-03 ENCOUNTER — Other Ambulatory Visit: Payer: Self-pay | Admitting: Family Medicine

## 2019-02-03 DIAGNOSIS — Z1231 Encounter for screening mammogram for malignant neoplasm of breast: Secondary | ICD-10-CM

## 2019-05-28 IMAGING — CT CT ABD-PELV W/ CM
2 of 5 series · 17 of 46 positions shown, 19 images · IV contrast (iopamidol)
Comparison: None.

CLINICAL DATA: Pt comes into the ED via POV c/o upper abdominal
pain that radiates into her back between her shoulder blades.
Patient states this has been ongoing for a couple of days and has
gotten worse. Mild chest pain associated with it.

EXAM:
CT ABDOMEN AND PELVIS WITH CONTRAST
TECHNIQUE: Multidetector CT imaging of the abdomen and pelvis was performed
using the standard protocol following bolus administration of
intravenous contrast.
CONTRAST:  100mL [id number] IOPAMIDOL ([id number]) INJECTION 61%

[Series 2: routine abd/pel with · axial · 0.82mm/px · z∈[-219,+161]mm · 14 of 86 slices shown, 16 images]
[im 5/86  soft-tissue]
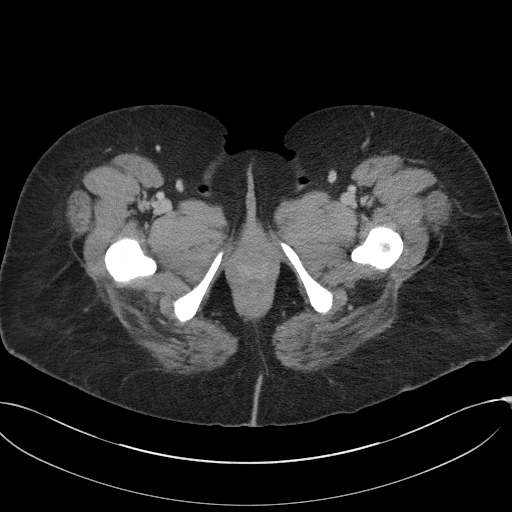
[im 5/86  bone]
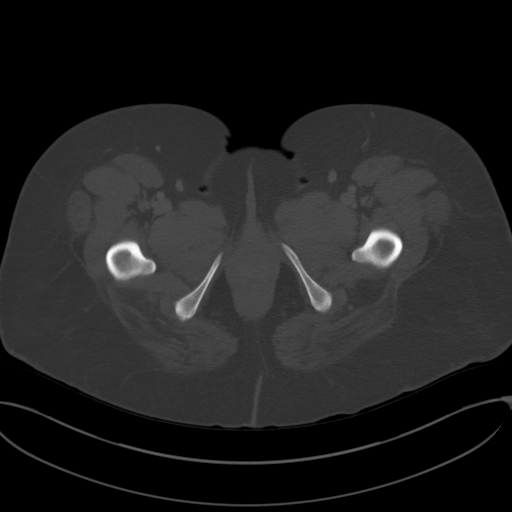
[im 10/86  soft-tissue]
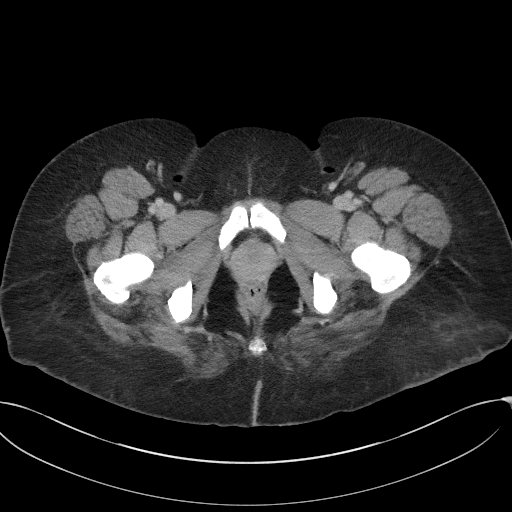
[im 19/86  soft-tissue]
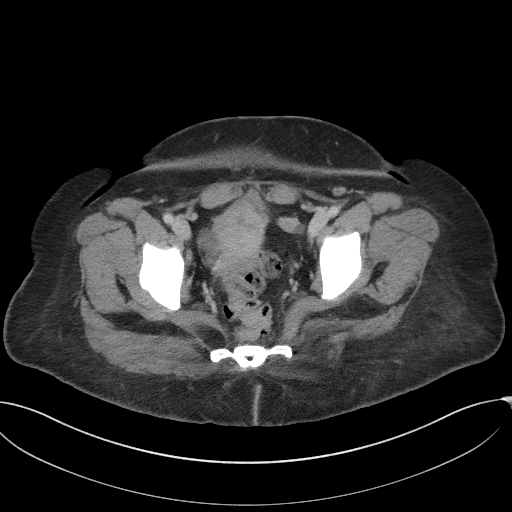
[im 24/86  soft-tissue]
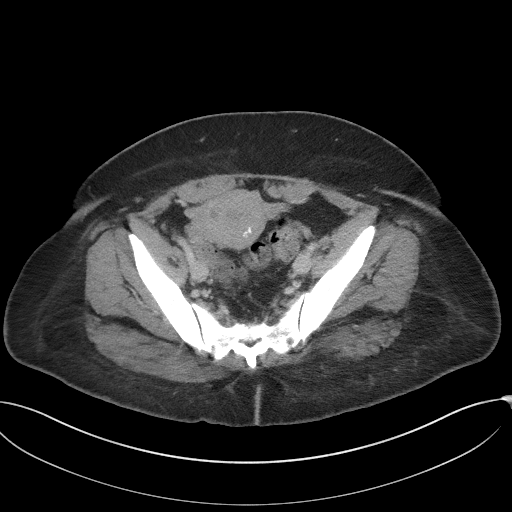
[im 29/86  soft-tissue]
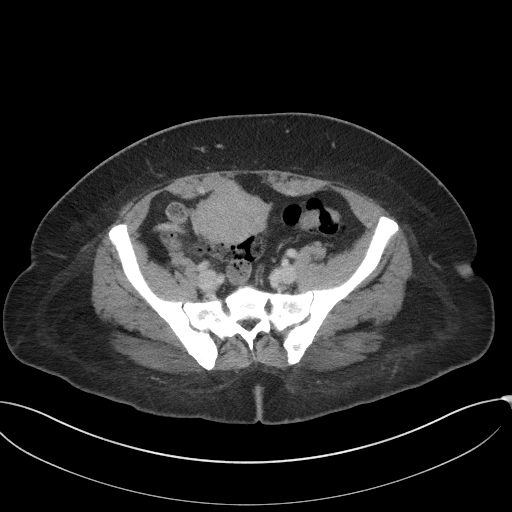
[im 34/86  soft-tissue]
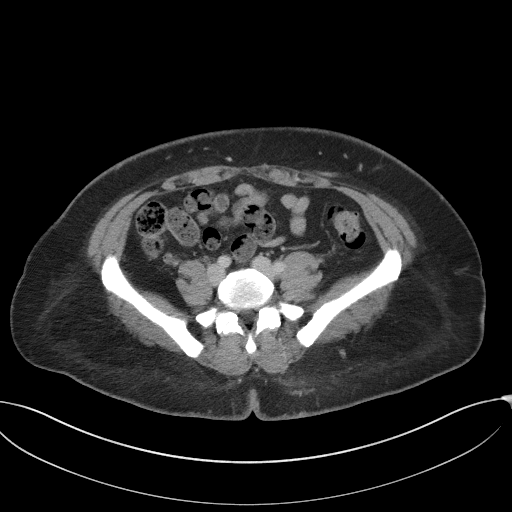
[im 38/86  soft-tissue]
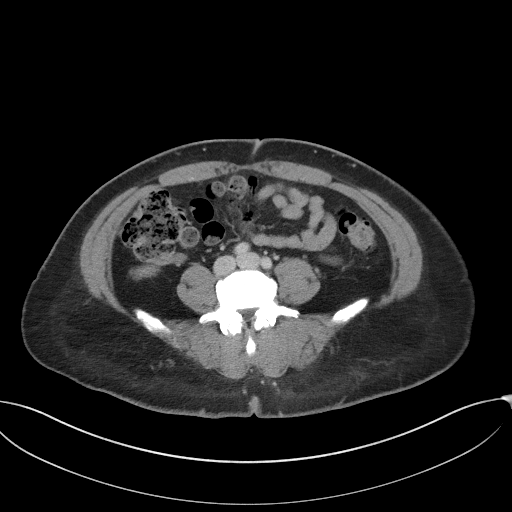
[im 48/86  soft-tissue]
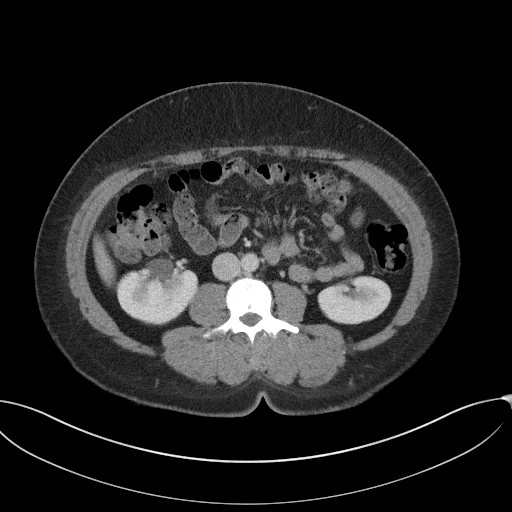
[im 52/86  soft-tissue]
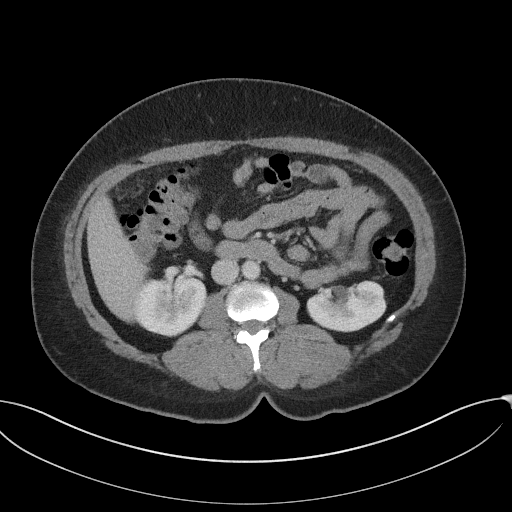
[im 52/86  bone]
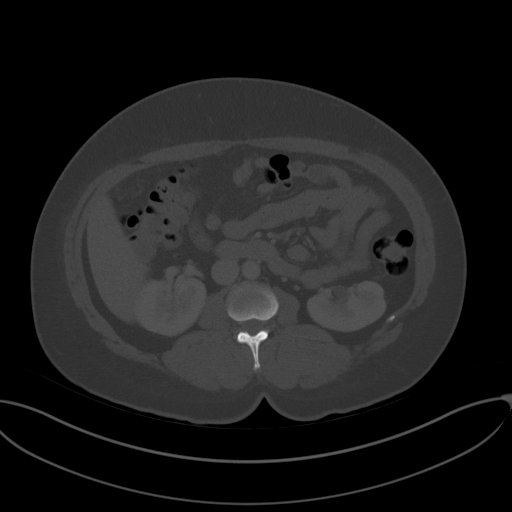
[im 57/86  soft-tissue]
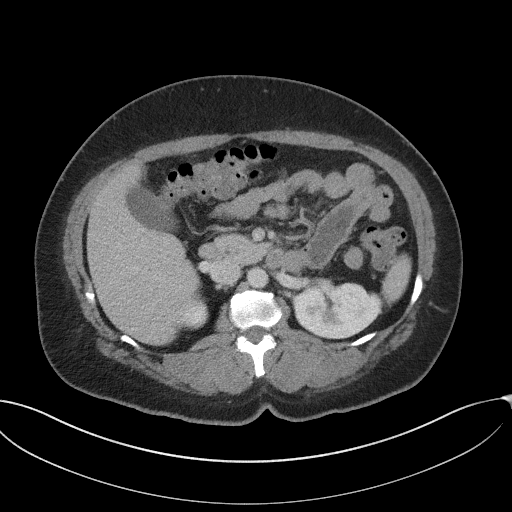
[im 62/86  soft-tissue]
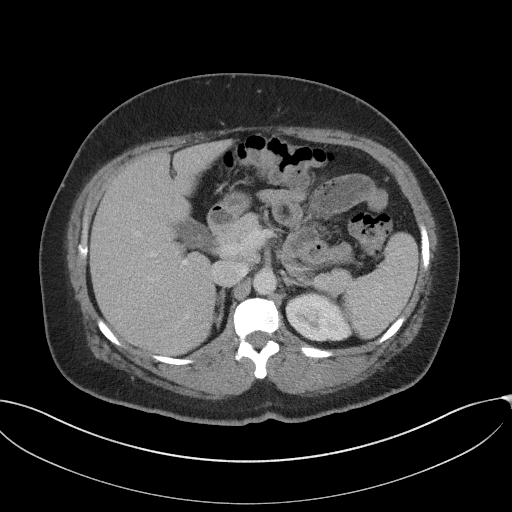
[im 67/86  soft-tissue]
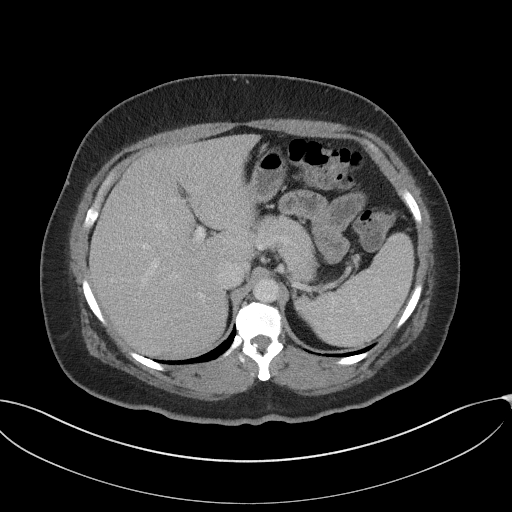
[im 76/86  soft-tissue]
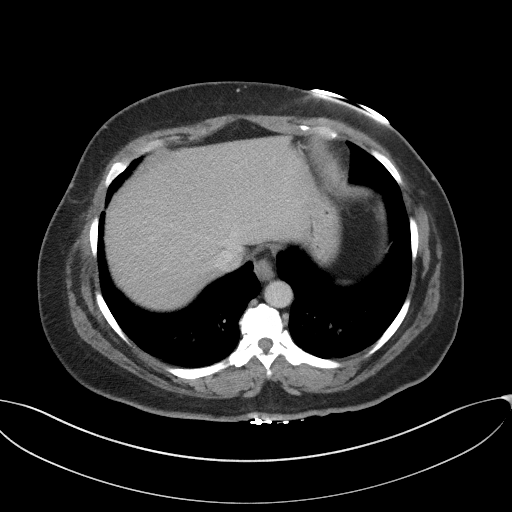
[im 81/86  soft-tissue]
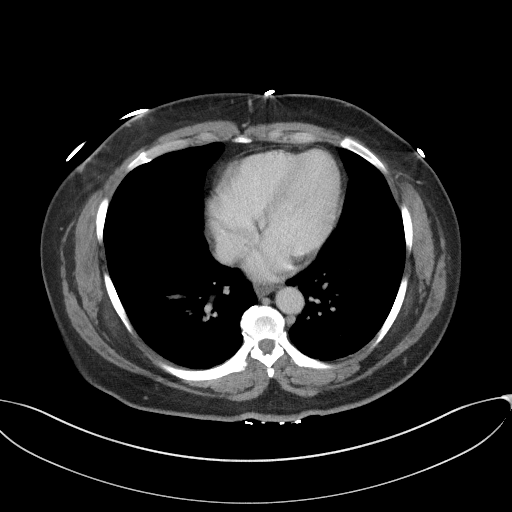

[Series 5: coronal st · coronal · 0.84mm/px · 3 of 87 slices shown]
[im 29/87  soft-tissue]
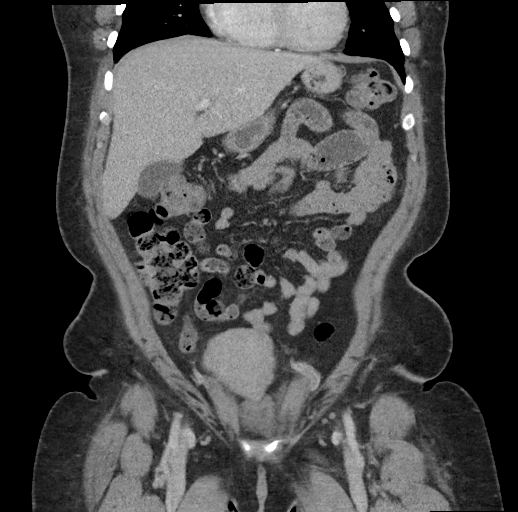
[im 39/87  soft-tissue]
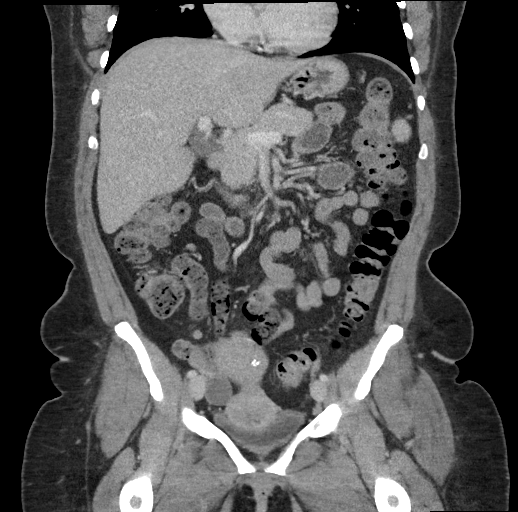
[im 48/87  soft-tissue]
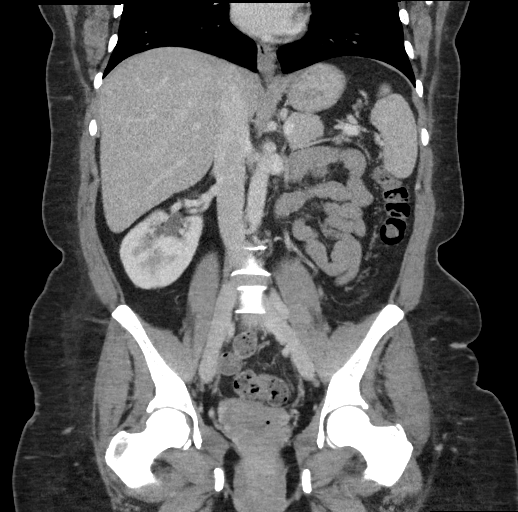

[17 of 46 positions shown; findings below may reference images not displayed]

FINDINGS: Lower chest: Lung bases are clear.

Hepatobiliary: No focal hepatic lesion. No biliary duct dilatation.
Gallbladder is normal. Common bile duct is normal.

Pancreas: Pancreas is normal. No ductal dilatation. No pancreatic
inflammation.

Spleen: Normal spleen

Adrenals/urinary tract: Adrenal glands and kidneys are normal. The
ureters and bladder normal.

Stomach/Bowel: Stomach, small bowel, appendix, and cecum are normal.
The colon and rectosigmoid colon are normal.

Vascular/Lymphatic: Abdominal aorta is normal caliber. No periportal
or retroperitoneal adenopathy. No pelvic adenopathy.

Reproductive: Uterus has a lobular contours with mural calcification
consistent leiomyoma. Ovaries normal

Other: No free fluid.

Musculoskeletal: No aggressive osseous lesion.
IMPRESSION: No acute abdominopelvic findings.

Leiomyomas uterus.

## 2023-09-12 ENCOUNTER — Emergency Department

## 2023-09-12 ENCOUNTER — Inpatient Hospital Stay
Admission: EM | Admit: 2023-09-12 | Discharge: 2023-09-14 | DRG: 872 | Disposition: A | Discharge status: Home / Self Care | Attending: Internal Medicine | Admitting: Internal Medicine

## 2023-09-12 ENCOUNTER — Other Ambulatory Visit: Payer: Self-pay

## 2023-09-12 DIAGNOSIS — N10 Acute tubulo-interstitial nephritis: Secondary | ICD-10-CM | POA: Diagnosis present

## 2023-09-12 DIAGNOSIS — Z791 Long term (current) use of non-steroidal anti-inflammatories (NSAID): Secondary | ICD-10-CM

## 2023-09-12 DIAGNOSIS — A419 Sepsis, unspecified organism: Principal | ICD-10-CM | POA: Diagnosis present

## 2023-09-12 DIAGNOSIS — E785 Hyperlipidemia, unspecified: Secondary | ICD-10-CM | POA: Diagnosis present

## 2023-09-12 DIAGNOSIS — F32A Depression, unspecified: Secondary | ICD-10-CM | POA: Diagnosis present

## 2023-09-12 DIAGNOSIS — Z79899 Other long term (current) drug therapy: Secondary | ICD-10-CM

## 2023-09-12 DIAGNOSIS — I2510 Atherosclerotic heart disease of native coronary artery without angina pectoris: Secondary | ICD-10-CM | POA: Diagnosis present

## 2023-09-12 DIAGNOSIS — E66811 Obesity, class 1: Secondary | ICD-10-CM | POA: Diagnosis present

## 2023-09-12 DIAGNOSIS — B9620 Unspecified Escherichia coli [E. coli] as the cause of diseases classified elsewhere: Secondary | ICD-10-CM | POA: Diagnosis present

## 2023-09-12 DIAGNOSIS — Z6834 Body mass index (BMI) 34.0-34.9, adult: Secondary | ICD-10-CM | POA: Diagnosis not present

## 2023-09-12 DIAGNOSIS — Z880 Allergy status to penicillin: Secondary | ICD-10-CM | POA: Diagnosis not present

## 2023-09-12 DIAGNOSIS — R109 Unspecified abdominal pain: Secondary | ICD-10-CM | POA: Diagnosis present

## 2023-09-12 DIAGNOSIS — F419 Anxiety disorder, unspecified: Secondary | ICD-10-CM | POA: Diagnosis present

## 2023-09-12 DIAGNOSIS — N12 Tubulo-interstitial nephritis, not specified as acute or chronic: Principal | ICD-10-CM

## 2023-09-12 DIAGNOSIS — I10 Essential (primary) hypertension: Secondary | ICD-10-CM | POA: Diagnosis present

## 2023-09-12 LAB — COMPREHENSIVE METABOLIC PANEL WITH GFR
ALT: 25 U/L (ref 0–44)
AST: 20 U/L (ref 15–41)
Albumin: 4 g/dL (ref 3.5–5.0)
Alkaline Phosphatase: 102 U/L (ref 38–126)
Anion gap: 12 (ref 5–15)
BUN: 17 mg/dL (ref 6–20)
CO2: 25 mmol/L (ref 22–32)
Calcium: 9.7 mg/dL (ref 8.9–10.3)
Chloride: 99 mmol/L (ref 98–111)
Creatinine, Ser: 0.56 mg/dL (ref 0.44–1.00)
GFR, Estimated: >60 mL/min (ref >60)
Glucose, Bld: 107 mg/dL — ABNORMAL HIGH (ref 70–99)
Potassium: 4 mmol/L (ref 3.5–5.1)
Sodium: 136 mmol/L (ref 135–145)
Total Bilirubin: 1 mg/dL (ref 0.0–1.2)
Total Protein: 7.6 g/dL (ref 6.5–8.1)

## 2023-09-12 LAB — PREGNANCY, URINE: Preg Test, Ur: NEGATIVE

## 2023-09-12 LAB — URINALYSIS, ROUTINE W REFLEX MICROSCOPIC
Bilirubin Urine: NEGATIVE
Glucose, UA: NEGATIVE mg/dL
Hgb urine dipstick: NEGATIVE
Ketones, ur: NEGATIVE mg/dL
Nitrite: NEGATIVE
Protein, ur: NEGATIVE mg/dL
Specific Gravity, Urine: 1.008 (ref 1.005–1.030)
WBC, UA: >50 WBC/hpf (ref 0–5)
pH: 5 (ref 5.0–8.0)

## 2023-09-12 LAB — LACTIC ACID, PLASMA
Lactic Acid, Venous: 0.9 mmol/L (ref 0.5–1.9)
Lactic Acid, Venous: 0.7 mmol/L (ref 0.5–1.9)

## 2023-09-12 LAB — CBC
HCT: 45.5 % (ref 36.0–46.0)
Hemoglobin: 14.7 g/dL (ref 12.0–15.0)
MCH: 28.5 pg (ref 26.0–34.0)
MCHC: 32.3 g/dL (ref 30.0–36.0)
MCV: 88.2 fL (ref 80.0–100.0)
Platelets: 331 10*3/uL (ref 150–400)
RBC: 5.16 MIL/uL — ABNORMAL HIGH (ref 3.87–5.11)
RDW: 12.9 % (ref 11.5–15.5)
WBC: 10.3 10*3/uL (ref 4.0–10.5)
nRBC: 0 % (ref 0.0–0.2)

## 2023-09-12 LAB — TROPONIN I (HIGH SENSITIVITY): Troponin I (High Sensitivity): 6 ng/L (ref <18)

## 2023-09-12 LAB — LIPASE, BLOOD: Lipase: 32 U/L (ref 11–51)

## 2023-09-12 MED ORDER — SODIUM CHLORIDE 0.9 % IV SOLN
2.0000 g | Freq: Once | INTRAVENOUS | Status: AC
  Administered 2023-09-12: 2 g via INTRAVENOUS
  Filled 2023-09-12: qty 20

## 2023-09-12 MED ORDER — SODIUM CHLORIDE 0.9 % IV SOLN
2.0000 g | INTRAVENOUS | Status: DC
  Administered 2023-09-13: 2 g via INTRAVENOUS
  Filled 2023-09-12 (×2): qty 20

## 2023-09-12 MED ORDER — IOHEXOL 350 MG/ML SOLN
75.0000 mL | Freq: Once | INTRAVENOUS | Status: AC | PRN
  Administered 2023-09-12: 75 mL via INTRAVENOUS

## 2023-09-12 MED ORDER — HYDRALAZINE HCL 20 MG/ML IJ SOLN: 5.0000 mg | Freq: Four times a day (QID) | INTRAMUSCULAR | Status: DC | PRN

## 2023-09-12 MED ORDER — KETOROLAC TROMETHAMINE 15 MG/ML IJ SOLN
15.0000 mg | Freq: Once | INTRAMUSCULAR | Status: AC
  Administered 2023-09-12: 15 mg via INTRAVENOUS
  Filled 2023-09-12: qty 1

## 2023-09-12 MED ORDER — TRAZODONE HCL 50 MG PO TABS: 50.0000 mg | ORAL_TABLET | Freq: Every evening | ORAL | Status: DC | PRN

## 2023-09-12 MED ORDER — SODIUM CHLORIDE 0.9 % IV BOLUS
1000.0000 mL | Freq: Once | INTRAVENOUS | Status: AC
  Administered 2023-09-12: 1000 mL via INTRAVENOUS

## 2023-09-12 MED ORDER — ENOXAPARIN SODIUM 40 MG/0.4ML IJ SOSY
40.0000 mg | PREFILLED_SYRINGE | INTRAMUSCULAR | Status: DC
  Administered 2023-09-12 – 2023-09-13 (×2): 40 mg via SUBCUTANEOUS
  Filled 2023-09-12 (×2): qty 0.4

## 2023-09-12 MED ORDER — FAMOTIDINE 20 MG PO TABS
20.0000 mg | ORAL_TABLET | Freq: Two times a day (BID) | ORAL | Status: DC
  Administered 2023-09-12 – 2023-09-14 (×4): 20 mg via ORAL
  Filled 2023-09-12 (×4): qty 1

## 2023-09-12 MED ORDER — SENNOSIDES-DOCUSATE SODIUM 8.6-50 MG PO TABS: 1.0000 | ORAL_TABLET | Freq: Every evening | ORAL | Status: DC | PRN

## 2023-09-12 MED ORDER — OXYCODONE HCL 5 MG PO TABS
5.0000 mg | ORAL_TABLET | ORAL | Status: DC | PRN
  Administered 2023-09-13 – 2023-09-14 (×5): 5 mg via ORAL
  Filled 2023-09-12 (×5): qty 1

## 2023-09-12 MED ORDER — ACETAMINOPHEN 500 MG PO TABS
1000.0000 mg | ORAL_TABLET | Freq: Once | ORAL | Status: AC
  Administered 2023-09-12: 1000 mg via ORAL
  Filled 2023-09-12: qty 2

## 2023-09-12 MED ORDER — ACETAMINOPHEN 325 MG PO TABS
650.0000 mg | ORAL_TABLET | Freq: Four times a day (QID) | ORAL | Status: DC | PRN
  Administered 2023-09-12: 650 mg via ORAL
  Filled 2023-09-12: qty 2

## 2023-09-12 MED ORDER — ONDANSETRON HCL 4 MG/2ML IJ SOLN: 4.0000 mg | Freq: Four times a day (QID) | INTRAMUSCULAR | Status: DC | PRN

## 2023-09-12 MED ORDER — BISACODYL 5 MG PO TBEC
5.0000 mg | DELAYED_RELEASE_TABLET | Freq: Every day | ORAL | Status: DC | PRN
  Administered 2023-09-13 – 2023-09-14 (×2): 5 mg via ORAL
  Filled 2023-09-12 (×2): qty 1

## 2023-09-12 MED ORDER — LACTATED RINGERS IV SOLN: INTRAVENOUS | Status: DC

## 2023-09-12 MED ORDER — ONDANSETRON HCL 4 MG/2ML IJ SOLN
4.0000 mg | Freq: Once | INTRAMUSCULAR | Status: AC
  Administered 2023-09-12: 4 mg via INTRAVENOUS
  Filled 2023-09-12: qty 2

## 2023-09-12 MED ORDER — SERTRALINE HCL 50 MG PO TABS
50.0000 mg | ORAL_TABLET | Freq: Every day | ORAL | Status: DC
  Administered 2023-09-12 – 2023-09-14 (×3): 50 mg via ORAL
  Filled 2023-09-12 (×3): qty 1

## 2023-09-12 MED ORDER — HYDROMORPHONE HCL 1 MG/ML IJ SOLN
0.5000 mg | Freq: Once | INTRAMUSCULAR | Status: AC
  Administered 2023-09-12: 0.5 mg via INTRAVENOUS
  Filled 2023-09-12: qty 0.5

## 2023-09-12 MED ORDER — HYDROXYZINE HCL 25 MG PO TABS: 25.0000 mg | ORAL_TABLET | Freq: Three times a day (TID) | ORAL | Status: DC | PRN

## 2023-09-12 MED ORDER — KETOROLAC TROMETHAMINE 30 MG/ML IJ SOLN
30.0000 mg | Freq: Four times a day (QID) | INTRAMUSCULAR | Status: DC | PRN
  Administered 2023-09-13 (×2): 30 mg via INTRAVENOUS
  Filled 2023-09-12 (×2): qty 1

## 2023-09-12 NOTE — H&P (Signed)
 History and Physical    Jillian Maldonado XBJ:[id number] DOB: 1970/08/06 DOA: 09/12/2023  PCP: System, Provider Not In (Confirm with patient/family/NH records and if not entered, this has to be entered at [facility] point of entry) Patient coming from: Home  I have personally briefly reviewed patient's old medical records in [facility]  Chief Complaint: Abdominal pain and flank pain, fever  HPI: Jillian Maldonado is a 53 y.o. female with medical history significant of HTN, anxiety/depression, presented with worsening of abdominal pain and right flank pain and fever.  Symptoms started 1 week ago, patient started to have suprapubic cramping-like abdominal pain but denies any dysuria.  Soon followed, she started develop right-sided flank pain and intermittent fever and chills.  Tmax 101.5 yesterday.  Overnight she started to have feeling of nauseous vomiting of stomach content nonbloody none bowel.  No diarrhea.  This morning she also has severe headache global like, associate with nauseous vomiting but no blurry vision denied any numbness weakness of the limbs. ED Course: Temperature 100.6 borderline tachycardia blood pressure 140/90 O2 saturation 98% on room air.  Blood work showed WBC 10.3 CT abdominal pelvis with contrast showed bilateral pyelonephritis.  UA showed 3+ WBC 1+ RBC.  Patient was given IV bolus and started on ceftriaxone IV.  Review of Systems: As per HPI otherwise 14 point review of systems negative.    Past Medical History:  Diagnosis Date   H/O suicide attempt    Hypertension     History reviewed. No pertinent surgical history.   reports that she has never smoked. She has never used smokeless tobacco. She reports current alcohol use. She reports that she does not use drugs.  Allergies  Allergen Reactions   Penicillins Rash    History reviewed. No pertinent family history.   Prior to Admission medications   Medication Sig Start Date End Date Taking? Authorizing  Provider  famotidine  (PEPCID ) 20 MG tablet Take 1 tablet (20 mg total) by mouth 2 (two) times daily. 07/17/17 07/17/18  Darolyn Elks, MD  hydrochlorothiazide  (HYDRODIURIL ) 25 MG tablet Take 25 mg by mouth daily. for high blood pressure 05/09/15   [provider]  hydrOXYzine  (ATARAX /VISTARIL ) 25 MG tablet Take 1 tablet (25 mg total) by mouth 3 (three) times daily as needed for anxiety (sleep). 05/22/15   Ferrel Hsu, NP  meloxicam  (MOBIC ) 15 MG tablet Take 1 tablet (15 mg total) by mouth daily. 02/14/18   Marcina Severe, PA-C  ondansetron  (ZOFRAN ) 4 MG tablet Take 1 tablet (4 mg total) by mouth every 8 (eight) hours as needed for nausea or vomiting. 07/17/17   Darolyn Elks, MD  sertraline  (ZOLOFT ) 50 MG tablet Take 1 tablet (50 mg total) by mouth daily. 05/22/15   Ferrel Hsu, NP  traZODone  (DESYREL ) 50 MG tablet Take 1 tablet (50 mg total) by mouth at bedtime as needed for sleep (May repeat x1). 05/22/15   Ferrel Hsu, NP    Physical Exam: Vitals:   09/12/23 1217 09/12/23 1220 09/12/23 1532  BP: (!) 144/98    Pulse: 92    Resp: 20    Temp: 99.5 F (37.5 C)  (!) 100.6 F (38.1 C)  TempSrc: Oral  Oral  SpO2: 98%    Weight:  78 kg   Height:  4' 11 (1.499 m)     Constitutional: NAD, calm, comfortable Vitals:   09/12/23 1217 09/12/23 1220 09/12/23 1532  BP: (!) 144/98    Pulse: 92  Resp: 20    Temp: 99.5 F (37.5 C)  (!) 100.6 F (38.1 C)  TempSrc: Oral  Oral  SpO2: 98%    Weight:  78 kg   Height:  4' 11 (1.499 m)    Eyes: PERRL, lids and conjunctivae normal ENMT: Mucous membranes are moist. Posterior pharynx clear of any exudate or lesions.Normal dentition.  Neck: normal, supple, no masses, no thyromegaly Respiratory: clear to auscultation bilaterally, no wheezing, no crackles. Normal respiratory effort. No accessory muscle use.  Cardiovascular: Regular rate and rhythm, no murmurs / rubs / gallops. No extremity edema. 2+ pedal pulses. No carotid bruits.   Abdomen: Right CVA tenderness, no masses palpated. No hepatosplenomegaly. Bowel sounds positive.  Musculoskeletal: no clubbing / cyanosis. No joint deformity upper and lower extremities. Good ROM, no contractures. Normal muscle tone.  Skin: no rashes, lesions, ulcers. No induration Neurologic: CN 2-12 grossly intact. Sensation intact, DTR normal. Strength 5/5 in all 4.  Psychiatric: Normal judgment and insight. Alert and oriented x 3. Normal mood.     Labs on Admission: I have personally reviewed following labs and imaging studies  CBC: Recent Labs  Lab 09/12/23 1220  WBC 10.3  HGB 14.7  HCT 45.5  MCV 88.2  PLT 331   Basic Metabolic Panel: Recent Labs  Lab 09/12/23 1220  NA 136  K 4.0  CL 99  CO2 25  GLUCOSE 107*  BUN 17  CREATININE 0.56  CALCIUM 9.7   GFR: Estimated Creatinine Clearance: 73.3 mL/min (by C-G formula based on SCr of 0.56 mg/dL). Liver Function Tests: Recent Labs  Lab 09/12/23 1220  AST 20  ALT 25  ALKPHOS 102  BILITOT 1.0  PROT 7.6  ALBUMIN 4.0   Recent Labs  Lab 09/12/23 1220  LIPASE 32   No results for input(s): AMMONIA in the last 168 hours. Coagulation Profile: No results for input(s): INR, PROTIME in the last 168 hours. Cardiac Enzymes: No results for input(s): CKTOTAL, CKMB, CKMBINDEX, TROPONINI in the last 168 hours. BNP (last 3 results) No results for input(s): PROBNP in the last 8760 hours. HbA1C: No results for input(s): HGBA1C in the last 72 hours. CBG: No results for input(s): GLUCAP in the last 168 hours. Lipid Profile: No results for input(s): CHOL, HDL, LDLCALC, TRIG, CHOLHDL, LDLDIRECT in the last 72 hours. Thyroid Function Tests: No results for input(s): TSH, T4TOTAL, FREET4, T3FREE, THYROIDAB in the last 72 hours. Anemia Panel: No results for input(s): VITAMINB12, FOLATE, FERRITIN, TIBC, IRON, RETICCTPCT in the last 72 hours. Urine analysis:    Component  Value Date/Time   COLORURINE STRAW (A) 09/12/2023 1222   APPEARANCEUR HAZY (A) 09/12/2023 1222   LABSPEC 1.008 09/12/2023 1222   PHURINE 5.0 09/12/2023 1222   GLUCOSEU NEGATIVE 09/12/2023 1222   HGBUR NEGATIVE 09/12/2023 1222   BILIRUBINUR NEGATIVE 09/12/2023 1222   KETONESUR NEGATIVE 09/12/2023 1222   PROTEINUR NEGATIVE 09/12/2023 1222   NITRITE NEGATIVE 09/12/2023 1222   LEUKOCYTESUR LARGE (A) 09/12/2023 1222    Radiological Exams on Admission: CT ABDOMEN PELVIS W CONTRAST Result Date: 09/12/2023 CLINICAL DATA:  Abdominal pain, acute, nonlocalized, right flank pain for 1 week EXAM: CT ABDOMEN AND PELVIS WITH CONTRAST TECHNIQUE: Multidetector CT imaging of the abdomen and pelvis was performed using the standard protocol following bolus administration of intravenous contrast. RADIATION DOSE REDUCTION: This exam was performed according to the departmental dose-optimization program which includes automated exposure control, adjustment of the mA and/or kV according to patient size and/or use of iterative reconstruction technique.  CONTRAST:  75mL OMNIPAQUE IOHEXOL 350 MG/ML SOLN COMPARISON:  07/17/2017 FINDINGS: Lower chest: Mild bibasilar atelectasis. Normal heart size. No pericardial or pleural effusion. Degenerative changes of the thoracic spine. No chest wall soft tissue abnormality or asymmetry. Hepatobiliary: No focal liver abnormality is seen. No gallstones, gallbladder wall thickening, or biliary dilatation. Pancreas: Unremarkable. No pancreatic ductal dilatation or surrounding inflammatory changes. Spleen: Normal in size without focal abnormality. Adrenals/Urinary Tract: Normal adrenal glands. Both kidneys demonstrate subtle mucosal enhancement of the collecting system and renal pelvis bilaterally. Minimal UPJ perinephric stranding more so on the right. Subtle striated nephrograms also more prominent on the right. Appearance compatible with mild acute pyelonephritis. No obstructing urinary tract  or ureteral calculus. No hydroureter. Bladder unremarkable. Stomach/Bowel: Stomach is within normal limits. Appendix appears normal. No evidence of bowel wall thickening, distention, or inflammatory changes. Vascular/Lymphatic: No significant vascular findings are present. No enlarged abdominal or pelvic lymph nodes. Reproductive: Uterus and bilateral adnexa are unremarkable. Small calcified degenerated fibroid noted. Other: No abdominal wall hernia or abnormality. No abdominopelvic ascites. Musculoskeletal: Degenerative changes throughout the spine. No acute osseous finding. IMPRESSION: 1. Findings compatible with mild acute bilateral pyelonephritis, more prominent on the right. Electronically Signed   By: Melven Stable.  Shick M.D.   On: 09/12/2023 15:20   CT ANGIO HEAD NECK W WO CM Result Date: 09/12/2023 CLINICAL DATA:  Neck trauma, neuro deficit or paresthesia. EXAM: CT ANGIOGRAPHY HEAD AND NECK WITH AND WITHOUT CONTRAST TECHNIQUE: Multidetector CT imaging of the head and neck was performed using the standard protocol during bolus administration of intravenous contrast. Multiplanar CT image reconstructions and MIPs were obtained to evaluate the vascular anatomy. Carotid stenosis measurements (when applicable) are obtained utilizing NASCET criteria, using the distal internal carotid diameter as the denominator. RADIATION DOSE REDUCTION: This exam was performed according to the departmental dose-optimization program which includes automated exposure control, adjustment of the mA and/or kV according to patient size and/or use of iterative reconstruction technique. CONTRAST:  75mL OMNIPAQUE IOHEXOL 350 MG/ML SOLN COMPARISON:  None Available. FINDINGS: CT HEAD FINDINGS Brain: No acute intracranial hemorrhage. No CT evidence of acute infarct. No edema, mass effect, or midline shift. The basilar cisterns are patent. Ventricles: Ventricles are normal in size and configuration. Vascular: No hyperdense vessel. Skull: No acute or  aggressive finding. Sinuses/orbits: Orbits are symmetric. Mild mucosal thickening in the ethmoid sinuses. Other: Mastoid air cells are clear. CTA NECK FINDINGS Aortic arch: Standard configuration of the aortic arch. Imaged portion shows no evidence of aneurysm or dissection. No significant stenosis of the major arch vessel origins. Pulmonary arteries: As permitted by contrast timing, there are no filling defects in the visualized pulmonary arteries. Subclavian arteries: The subclavian arteries are patent bilaterally. Right carotid system: No evidence of dissection, stenosis (50% or greater), or occlusion. Left carotid system: No evidence of dissection, stenosis (50% or greater), or occlusion. Vertebral arteries: Codominant. No evidence of dissection, stenosis (50% or greater), or occlusion. Tortuosity of the left V1 segment. Skeleton: No acute or aggressive finding noted. Other neck: The visualized airway is patent. No cervical lymphadenopathy. Upper chest: No acute abnormality. Review of the MIP images confirms the above findings CTA HEAD FINDINGS ANTERIOR CIRCULATION: The intracranial ICAs are patent bilaterally. Mild atherosclerosis of the left carotid siphon. No significant stenosis, proximal occlusion, aneurysm, or vascular malformation. MCAs: The middle cerebral arteries are patent bilaterally. ACAs: The anterior cerebral arteries are patent bilaterally. POSTERIOR CIRCULATION: No significant stenosis, proximal occlusion, aneurysm, or vascular malformation. PCAs: The posterior cerebral  arteries are patent bilaterally. Pcomm: Not well visualized. SCAs: The superior cerebellar arteries are patent bilaterally. Basilar artery: Patent AICAs: Visualized on the right. PICAs: Visualized on the left. Vertebral arteries: The intracranial vertebral arteries are patent. Venous sinuses: As permitted by contrast timing, patent. Anatomic variants: None Review of the MIP images confirms the above findings IMPRESSION: No acute  arterial injury in the head or neck. No large vessel occlusion, high-grade stenosis, aneurysm, or dissection. No CT evidence of acute intracranial abnormality. Electronically Signed   By: Denny Flack M.D.   On: 09/12/2023 14:53    EKG: Independently reviewed.  Sinus rhythm, no acute ST changes.  Assessment/Plan Principal Problem:   Sepsis (HCC) Active Problems:   Acute pyelonephritis  (please populate well all problems here in Problem List. (For example, if patient is on BP meds at home and you resume or decide to hold them, it is a problem that needs to be her. Same for CAD, COPD, HLD and so on)  Acute bilateral pyelonephritis Complicated UTI with hematuria - Continue ceftriaxone - Urine culture - As needed Zofran  and as needed Toradol for symptoms  HTN -Hold off home BP meds -Start PRN Hydralazine  Anxiety/Depression -Mentation at baseline, continue home dose of SSRI.  Obesity - BMI= 34, recommend calorie control  DVT prophylaxis: Lovenox Code Status: Full code Family Communication: Daughter at bedside Disposition Plan: Patient is sick with bilateral pyonephritis, requiring IV antibiotics, expect more than 2 midnight hospital stay Consults called: None Admission status: Telemetry admission   [place] MD [facility] Pager [phone]  09/12/2023, 3:51 PM

## 2023-09-12 NOTE — ED Notes (Signed)
 Pt to CT

## 2023-09-12 NOTE — Sepsis Progress Note (Signed)
 eLink is following this Code Sepsis.

## 2023-09-12 NOTE — ED Provider Notes (Signed)
 [facility] Provider Note    Event Date/Time   First MD Initiated Contact with Patient 09/12/23 1250     (approximate)   History   Flank Pain   HPI  Jillian Maldonado is a 53 y.o. female who comes in with concerns for right flank pain for about a week.  Patient reports severe pain.  She denies any obvious urinary symptoms.  Does report a severe headache that started yesterday gradually worsening.  It she denies any chest pain, shortness of breath.  Does report some nausea, vomiting  Physical Exam   Triage Vital Signs: ED Triage Vitals  Encounter Vitals Group     BP 09/12/23 1217 (!) 144/98     Girls Systolic BP Percentile --      Girls Diastolic BP Percentile --      Boys Systolic BP Percentile --      Boys Diastolic BP Percentile --      Pulse Rate 09/12/23 1217 92     Resp 09/12/23 1217 20     Temp 09/12/23 1217 99.5 F (37.5 C)     Temp Source 09/12/23 1217 Oral     SpO2 09/12/23 1217 98 %     Weight 09/12/23 1220 172 lb (78 kg)     Height 09/12/23 1220 4' 11 (1.499 m)     Head Circumference --      Peak Flow --      Pain Score 09/12/23 1217 10     Pain Loc --      Pain Education --      Exclude from Growth Chart --     Most recent vital signs: Vitals:   09/12/23 1217  BP: (!) 144/98  Pulse: 92  Resp: 20  Temp: 99.5 F (37.5 C)  SpO2: 98%     General: Awake, no distress.  CV:  Good peripheral perfusion.  Resp:  Normal effort.  Abd:  No distention.  Tender in the lower abdomen more on the right side Other:     ED Results / Procedures / Treatments   Labs (all labs ordered are listed, but only abnormal results are displayed) Labs Reviewed  CBC - Abnormal; Notable for the following components:      Result Value   RBC 5.16 (*)    All other components within normal limits  LIPASE, BLOOD  COMPREHENSIVE METABOLIC PANEL WITH GFR  URINALYSIS, ROUTINE W REFLEX MICROSCOPIC  POC URINE PREG, ED     EKG  My interpretation  of EKG:  EKG is sinus rate of 70 without any ST elevation or T wave versions, normal intervals  RADIOLOGY I have reviewed the ct  personally and interpreted no obvious kidney stone   PROCEDURES:  Critical Care performed: Yes, see critical care procedure note(s)  .Critical Care  Performed by: Lubertha Rush, MD Authorized by: Lubertha Rush, MD   Critical care provider statement:    Critical care time (minutes):  30   Critical care was necessary to treat or prevent imminent or life-threatening deterioration of the following conditions:  Sepsis   Critical care was time spent personally by me on the following activities:  Development of treatment plan with patient or surrogate, discussions with consultants, evaluation of patient's response to treatment, examination of patient, ordering and review of laboratory studies, ordering and review of radiographic studies, ordering and performing treatments and interventions, pulse oximetry, re-evaluation of patient's condition and review of old charts .1-3 Lead EKG Interpretation  Performed  by: Lubertha Rush, MD Authorized by: Lubertha Rush, MD     Interpretation: normal     ECG rate:  90   ECG rate assessment: normal     Rhythm: sinus rhythm     Ectopy: none     Conduction: normal      MEDICATIONS ORDERED IN ED: Medications  ketorolac (TORADOL) 15 MG/ML injection 15 mg (has no administration in time range)  cefTRIAXone (ROCEPHIN) 2 g in sodium chloride  0.9 % 100 mL IVPB (has no administration in time range)  lactated ringers infusion (has no administration in time range)  sodium chloride  0.9 % bolus 1,000 mL (has no administration in time range)  sodium chloride  0.9 % bolus 1,000 mL (has no administration in time range)  acetaminophen  (TYLENOL ) tablet 1,000 mg (has no administration in time range)  HYDROmorphone (DILAUDID) injection 0.5 mg (0.5 mg Intravenous Given 09/12/23 1341)  ondansetron  (ZOFRAN ) injection 4 mg (4 mg Intravenous  Given 09/12/23 1340)  iohexol (OMNIPAQUE) 350 MG/ML injection 75 mL (75 mLs Intravenous Contrast Given 09/12/23 1358)     IMPRESSION / MDM / ASSESSMENT AND PLAN / ED COURSE  I reviewed the triage vital signs and the nursing notes.   Patient's presentation is most consistent with acute presentation with potential threat to life or bodily function.  Patient comes in with concerns for right lower quadrant abdominal pain right flank pain CT imaging ordered evaluate for appendicitis, diverticulitis, kidney stone, pyelonephritis.  She also reports a severe headache yesterday's will get CT angio given patient is crying in pain to ensure no evidence of aneurysm, intracranial hemorrhage.  Patient treated with IV Dilaudid IV Zofran   CT of the head is negative.  Does have bilateral pyelonephritis.  I rechecked her temperature and her temperature was 100.6 therefore patient met sepsis criteria  CBC shows normal white count.  Pregnancy test negative urine concerning for UTI.  CMP reassuring.  Given the above I discussed with patient and given she continues to have significant pain we will discuss possible team for admission  The patient is on the cardiac monitor to evaluate for evidence of arrhythmia and/or significant heart rate changes.      FINAL CLINICAL IMPRESSION(S) / ED DIAGNOSES   Final diagnoses:  Pyelonephritis  Sepsis, due to unspecified organism, unspecified whether acute organ dysfunction present [facility])     Rx / DC Orders   ED Discharge Orders     None        Note:  This document was prepared using Dragon voice recognition software and may include unintentional dictation errors.   Lubertha Rush, MD 09/12/23 1535

## 2023-09-12 NOTE — ED Triage Notes (Signed)
 Pt to ED via POV for c/o right flank pain for a week. Pt states chronic difficulty when having a bowel movement. States pain is also felt in abd and back. Rates pain at 10.

## 2023-09-12 NOTE — Code Documentation (Signed)
 CODE SEPSIS - PHARMACY COMMUNICATION  **Broad Spectrum Antibiotics should be administered within 1 hour of Sepsis diagnosis**  Time Code Sepsis Called/Page Received: 1532  Antibiotics Ordered: ceftriaxone  Time of 1st antibiotic administration: 1553  Additional action taken by pharmacy: none required  If necessary, Name of Provider/Nurse Contacted: N/A    Adalberto Acton ,PharmD Clinical Pharmacist  09/12/2023  3:38 PM

## 2023-09-13 ENCOUNTER — Inpatient Hospital Stay

## 2023-09-13 DIAGNOSIS — A419 Sepsis, unspecified organism: Secondary | ICD-10-CM | POA: Diagnosis not present

## 2023-09-13 DIAGNOSIS — E66811 Obesity, class 1: Secondary | ICD-10-CM | POA: Diagnosis not present

## 2023-09-13 DIAGNOSIS — N10 Acute tubulo-interstitial nephritis: Secondary | ICD-10-CM | POA: Diagnosis not present

## 2023-09-13 LAB — BASIC METABOLIC PANEL WITH GFR
Anion gap: 8 (ref 5–15)
BUN: 12 mg/dL (ref 6–20)
CO2: 27 mmol/L (ref 22–32)
Calcium: 8.8 mg/dL — ABNORMAL LOW (ref 8.9–10.3)
Chloride: 102 mmol/L (ref 98–111)
Creatinine, Ser: 0.59 mg/dL (ref 0.44–1.00)
GFR, Estimated: >60 mL/min (ref >60)
Glucose, Bld: 115 mg/dL — ABNORMAL HIGH (ref 70–99)
Potassium: 3.9 mmol/L (ref 3.5–5.1)
Sodium: 137 mmol/L (ref 135–145)

## 2023-09-13 LAB — CBC
HCT: 39.5 % (ref 36.0–46.0)
Hemoglobin: 12.8 g/dL (ref 12.0–15.0)
MCH: 28.6 pg (ref 26.0–34.0)
MCHC: 32.4 g/dL (ref 30.0–36.0)
MCV: 88.2 fL (ref 80.0–100.0)
Platelets: 261 10*3/uL (ref 150–400)
RBC: 4.48 MIL/uL (ref 3.87–5.11)
RDW: 13.1 % (ref 11.5–15.5)
WBC: 10.4 10*3/uL (ref 4.0–10.5)
nRBC: 0 % (ref 0.0–0.2)

## 2023-09-13 LAB — HIV ANTIBODY (ROUTINE TESTING W REFLEX): HIV Screen 4th Generation wRfx: NONREACTIVE

## 2023-09-13 MED ORDER — BUTALBITAL-APAP-CAFFEINE 50-325-40 MG PO TABS: 1.0000 | ORAL_TABLET | Freq: Four times a day (QID) | ORAL | Status: DC | PRN

## 2023-09-13 NOTE — Plan of Care (Signed)
   Problem: Education: Goal: Knowledge of General Education information will improve Description Including pain rating scale, medication(s)/side effects and non-pharmacologic comfort measures Outcome: Progressing

## 2023-09-13 NOTE — Plan of Care (Signed)

## 2023-09-13 NOTE — Progress Notes (Signed)
  Progress Note   Patient: Jillian Maldonado ZOX:[id number] DOB: 01-21-1971 DOA: 09/12/2023     1 DOS: the patient was seen and examined on 09/13/2023   Brief hospital course:  Jillian Maldonado is a 53 y.o. female with medical history significant of HTN, anxiety/depression, presented with worsening of abdominal pain and right flank pain and fever.  Upon arriving to hospital, she had temperature 100.6, heart rate 92, Urine study had a large leukocytes, blood culture was sent out so far negative.  Urine culture pending.  CT scan showed evidence of bilateral pyelonephritis.  Patient was started on Rocephin.   Principal Problem:   Sepsis (HCC) Active Problems:   Acute pyelonephritis   Assessment and Plan: Sepsis secondary to acute pyelonephritis Acute pyelonephritis. Patient met sepsis criteria at time of admission due to temperature and heart rate, lactic acid was normal.  This is due to acute pyelonephritis. Blood cultures so far negative, urine culture still pending.  Will continue Rocephin for now. Patient has some tenderness in the right upper quadrant, will obtain right upper quadrant ultrasound to rule out cholecystitis.  Most likely this is still from pyelonephritis.  Class I obesity.  BMI 34.74. Diet and excise.      Subjective:  Patient was complaining of some right upper quadrant abdominal pain, nausea, no vomiting.  Normal bowel movement yesterday.  Physical Exam: Vitals:   09/12/23 1644 09/12/23 2034 09/13/23 0424 09/13/23 0813  BP: (!) 140/89 (!) 110/57 125/66 125/65  Pulse:  65 63 68  Resp: 18 16  18   Temp: (!) 100.5 F (38.1 C) 98.6 F (37 C) 98.9 F (37.2 C) 98.2 F (36.8 C)  TempSrc: Oral Oral Oral   SpO2: 100% 100% 99% 95%  Weight:      Height:       General exam: Appears calm and comfortable  Respiratory system: Clear to auscultation. Respiratory effort normal. Cardiovascular system: S1 & S2 heard, RRR. No JVD, murmurs, rubs, gallops or clicks. No  pedal edema. Gastrointestinal system: Abdomen is nondistended, soft and RUQ tender. No organomegaly or masses felt. Normal bowel sounds heard. Central nervous system: Alert and oriented. No focal neurological deficits. Extremities: Symmetric 5 x 5 power. Skin: No rashes, lesions or ulcers Psychiatry: Judgement and insight appear normal. Mood & affect appropriate.    Data Reviewed:  Reviewed CT scan results and lab results.  Family Communication: None  Disposition: Status is: Inpatient Remains inpatient appropriate because: Severity of disease, IV treatment.     Time spent: 35 minutes  Author: Donaciano Frizzle, MD 09/13/2023 11:06 AM  For on call review [email or web address].

## 2023-09-13 NOTE — Hospital Course (Signed)
 Jillian Maldonado is a 53 y.o. female with medical history significant of HTN, anxiety/depression, presented with worsening of abdominal pain and right flank pain and fever.  Upon arriving to hospital, she had temperature 100.6, heart rate 92, Urine study had a large leukocytes, blood culture was sent out so far negative.  Urine culture pending.  CT scan showed evidence of bilateral pyelonephritis.  Patient was started on Rocephin. Patient condition improved, no additional fever.  Symptom resolved.  However, urine culture came back with ESBL E. Coli.  Since condition has improved, patient is medically stable for discharge.  Patient will be receiving 1 dose of meropenem, followed with 7 days of Cipro.

## 2023-09-14 DIAGNOSIS — N10 Acute tubulo-interstitial nephritis: Secondary | ICD-10-CM | POA: Diagnosis not present

## 2023-09-14 DIAGNOSIS — A419 Sepsis, unspecified organism: Secondary | ICD-10-CM | POA: Diagnosis not present

## 2023-09-14 DIAGNOSIS — E66811 Obesity, class 1: Secondary | ICD-10-CM | POA: Diagnosis not present

## 2023-09-14 LAB — URINE CULTURE: Culture: 90000 — AB

## 2023-09-14 MED ORDER — SODIUM CHLORIDE 0.9 % IV SOLN
1.0000 g | Freq: Three times a day (TID) | INTRAVENOUS | Status: DC
  Administered 2023-09-14: 1 g via INTRAVENOUS
  Filled 2023-09-14 (×2): qty 20

## 2023-09-14 MED ORDER — CIPROFLOXACIN HCL 500 MG PO TABS: 500.0000 mg | ORAL_TABLET | Freq: Two times a day (BID) | ORAL | 0 refills | Status: AC

## 2023-09-14 MED ORDER — POLYETHYLENE GLYCOL 3350 17 G PO PACK: 17.0000 g | PACK | Freq: Every day | ORAL | 0 refills | Status: AC | PRN

## 2023-09-14 NOTE — Discharge Summary (Signed)
 Physician Discharge Summary   Patient: Jillian Maldonado MRN: [id number] DOB: [birth year]  Admit date:     09/12/2023  Discharge date: 09/14/23  Discharge Physician: Donaciano Frizzle   PCP: System, Provider Not In   Recommendations at discharge:   Advised patient to be followed with PCP in 1 week, patient has a PCP which is not listed.  Discharge Diagnoses: Principal Problem:   Sepsis (HCC) Active Problems:   Acute pyelonephritis   Class 1 obesity  Resolved Problems:   * No resolved hospital problems. *  Hospital Course:  Jillian Maldonado is a 53 y.o. female with medical history significant of HTN, anxiety/depression, presented with worsening of abdominal pain and right flank pain and fever.  Upon arriving to hospital, she had temperature 100.6, heart rate 92, Urine study had a large leukocytes, blood culture was sent out so far negative.  Urine culture pending.  CT scan showed evidence of bilateral pyelonephritis.  Patient was started on Rocephin. Patient condition improved, no additional fever.  Symptom resolved.  However, urine culture came back with ESBL E. Coli.  Since condition has improved, patient is medically stable for discharge.  Patient will be receiving 1 dose of meropenem, followed with 7 days of Cipro.  Assessment and Plan:  Sepsis secondary to acute pyelonephritis Acute pyelonephritis secondary to E. coli ESBL. Patient met sepsis criteria at time of admission due to temperature and heart rate, lactic acid was normal.  This is due to acute pyelonephritis. Patient has some tenderness in the right upper quadrant, which appears to be secondary to right-sided pyelonephritis.  Right upper quadrant ultrasound did not show any cholecystitis or gallstone. Blood culture came back negative, urine culture grew E Coli ESBL. Patient clinically improved, will give a dose of meropenem followed by 7 days of Cipro based on susceptibility.    Class I obesity.  BMI 34.74. Diet and  excise.       Consultants: None Procedures performed: None  Disposition: Home Diet recommendation:  Discharge Diet Orders (From admission, onward)     Start     Ordered   09/14/23 0000  Diet - low sodium heart healthy        09/14/23 1011           Cardiac diet DISCHARGE MEDICATION: Allergies as of 09/14/2023       Reactions   Penicillins Rash        Medication List     STOP taking these medications    hydrOXYzine  25 MG tablet Commonly known as: ATARAX    meloxicam  15 MG tablet Commonly known as: MOBIC    ondansetron  4 MG tablet Commonly known as: Zofran    sertraline  50 MG tablet Commonly known as: ZOLOFT    traZODone  50 MG tablet Commonly known as: DESYREL        TAKE these medications    ciprofloxacin 500 MG tablet Commonly known as: Cipro Take 1 tablet (500 mg total) by mouth 2 (two) times daily for 7 days.   famotidine  20 MG tablet Commonly known as: PEPCID  Take 1 tablet (20 mg total) by mouth 2 (two) times daily.   hydrochlorothiazide  25 MG tablet Commonly known as: HYDRODIURIL  Take 25 mg by mouth daily. for high blood pressure   omeprazole 40 MG capsule Commonly known as: PRILOSEC Take 40 mg by mouth 2 (two) times daily.        Discharge Exam: Filed Weights   09/12/23 1220  Weight: 78 kg   General exam: Appears calm and comfortable  Respiratory system:  Clear to auscultation. Respiratory effort normal. Cardiovascular system: S1 & S2 heard, RRR. No JVD, murmurs, rubs, gallops or clicks. No pedal edema. Gastrointestinal system: Abdomen is nondistended, soft and nontender. No organomegaly or masses felt. Normal bowel sounds heard. Central nervous system: Alert and oriented. No focal neurological deficits. Extremities: Symmetric 5 x 5 power. Skin: No rashes, lesions or ulcers Psychiatry: Judgement and insight appear normal. Mood & affect appropriate.    Condition at discharge: good  The results of significant diagnostics from  this hospitalization (including imaging, microbiology, ancillary and laboratory) are listed below for reference.   Imaging Studies: US  Abdomen Limited RUQ (LIVER/GB) Result Date: 09/13/2023 CLINICAL DATA:  [id number] Cholecystitis 92631.  Pyelonephritis. EXAM: ULTRASOUND ABDOMEN LIMITED RIGHT UPPER QUADRANT COMPARISON:  CT abdomen pelvis 09/12/2023 FINDINGS: Gallbladder: No gallstones or wall thickening visualized. No sonographic Murphy sign noted by sonographer. Common bile duct: Diameter: 4 mm. Liver: No focal lesion identified. Within normal limits in parenchymal echogenicity. Portal vein is patent on color Doppler imaging with normal direction of blood flow towards the liver. Other: Trace perinephric free fluid on the right. IMPRESSION: 1. Trace perinephric free fluid on the right. Finding likely related to pyelonephritis noted on CT. 2. Otherwise right upper quadrant ultrasound. Electronically Signed   By: Morgane  Naveau M.D.   On: 09/13/2023 16:19   CT ABDOMEN PELVIS W CONTRAST Result Date: 09/12/2023 CLINICAL DATA:  Abdominal pain, acute, nonlocalized, right flank pain for 1 week EXAM: CT ABDOMEN AND PELVIS WITH CONTRAST TECHNIQUE: Multidetector CT imaging of the abdomen and pelvis was performed using the standard protocol following bolus administration of intravenous contrast. RADIATION DOSE REDUCTION: This exam was performed according to the departmental dose-optimization program which includes automated exposure control, adjustment of the mA and/or kV according to patient size and/or use of iterative reconstruction technique. CONTRAST:  75mL OMNIPAQUE IOHEXOL 350 MG/ML SOLN COMPARISON:  07/17/2017 FINDINGS: Lower chest: Mild bibasilar atelectasis. Normal heart size. No pericardial or pleural effusion. Degenerative changes of the thoracic spine. No chest wall soft tissue abnormality or asymmetry. Hepatobiliary: No focal liver abnormality is seen. No gallstones, gallbladder wall thickening, or biliary  dilatation. Pancreas: Unremarkable. No pancreatic ductal dilatation or surrounding inflammatory changes. Spleen: Normal in size without focal abnormality. Adrenals/Urinary Tract: Normal adrenal glands. Both kidneys demonstrate subtle mucosal enhancement of the collecting system and renal pelvis bilaterally. Minimal UPJ perinephric stranding more so on the right. Subtle striated nephrograms also more prominent on the right. Appearance compatible with mild acute pyelonephritis. No obstructing urinary tract or ureteral calculus. No hydroureter. Bladder unremarkable. Stomach/Bowel: Stomach is within normal limits. Appendix appears normal. No evidence of bowel wall thickening, distention, or inflammatory changes. Vascular/Lymphatic: No significant vascular findings are present. No enlarged abdominal or pelvic lymph nodes. Reproductive: Uterus and bilateral adnexa are unremarkable. Small calcified degenerated fibroid noted. Other: No abdominal wall hernia or abnormality. No abdominopelvic ascites. Musculoskeletal: Degenerative changes throughout the spine. No acute osseous finding. IMPRESSION: 1. Findings compatible with mild acute bilateral pyelonephritis, more prominent on the right. Electronically Signed   By: Melven Stable.  Shick M.D.   On: 09/12/2023 15:20   CT ANGIO HEAD NECK W WO CM Result Date: 09/12/2023 CLINICAL DATA:  Neck trauma, neuro deficit or paresthesia. EXAM: CT ANGIOGRAPHY HEAD AND NECK WITH AND WITHOUT CONTRAST TECHNIQUE: Multidetector CT imaging of the head and neck was performed using the standard protocol during bolus administration of intravenous contrast. Multiplanar CT image reconstructions and MIPs were obtained to evaluate the vascular anatomy. Carotid stenosis measurements (when  applicable) are obtained utilizing NASCET criteria, using the distal internal carotid diameter as the denominator. RADIATION DOSE REDUCTION: This exam was performed according to the departmental dose-optimization program which  includes automated exposure control, adjustment of the mA and/or kV according to patient size and/or use of iterative reconstruction technique. CONTRAST:  75mL OMNIPAQUE IOHEXOL 350 MG/ML SOLN COMPARISON:  None Available. FINDINGS: CT HEAD FINDINGS Brain: No acute intracranial hemorrhage. No CT evidence of acute infarct. No edema, mass effect, or midline shift. The basilar cisterns are patent. Ventricles: Ventricles are normal in size and configuration. Vascular: No hyperdense vessel. Skull: No acute or aggressive finding. Sinuses/orbits: Orbits are symmetric. Mild mucosal thickening in the ethmoid sinuses. Other: Mastoid air cells are clear. CTA NECK FINDINGS Aortic arch: Standard configuration of the aortic arch. Imaged portion shows no evidence of aneurysm or dissection. No significant stenosis of the major arch vessel origins. Pulmonary arteries: As permitted by contrast timing, there are no filling defects in the visualized pulmonary arteries. Subclavian arteries: The subclavian arteries are patent bilaterally. Right carotid system: No evidence of dissection, stenosis (50% or greater), or occlusion. Left carotid system: No evidence of dissection, stenosis (50% or greater), or occlusion. Vertebral arteries: Codominant. No evidence of dissection, stenosis (50% or greater), or occlusion. Tortuosity of the left V1 segment. Skeleton: No acute or aggressive finding noted. Other neck: The visualized airway is patent. No cervical lymphadenopathy. Upper chest: No acute abnormality. Review of the MIP images confirms the above findings CTA HEAD FINDINGS ANTERIOR CIRCULATION: The intracranial ICAs are patent bilaterally. Mild atherosclerosis of the left carotid siphon. No significant stenosis, proximal occlusion, aneurysm, or vascular malformation. MCAs: The middle cerebral arteries are patent bilaterally. ACAs: The anterior cerebral arteries are patent bilaterally. POSTERIOR CIRCULATION: No significant stenosis, proximal  occlusion, aneurysm, or vascular malformation. PCAs: The posterior cerebral arteries are patent bilaterally. Pcomm: Not well visualized. SCAs: The superior cerebellar arteries are patent bilaterally. Basilar artery: Patent AICAs: Visualized on the right. PICAs: Visualized on the left. Vertebral arteries: The intracranial vertebral arteries are patent. Venous sinuses: As permitted by contrast timing, patent. Anatomic variants: None Review of the MIP images confirms the above findings IMPRESSION: No acute arterial injury in the head or neck. No large vessel occlusion, high-grade stenosis, aneurysm, or dissection. No CT evidence of acute intracranial abnormality. Electronically Signed   By: Denny Flack M.D.   On: 09/12/2023 14:53    Microbiology: Results for orders placed or performed during the hospital encounter of 09/12/23  Urine Culture     Status: Abnormal   Collection Time: 09/12/23 12:22 PM   Specimen: Urine, Clean Catch  Result Value Ref Range Status   Specimen Description   Final    URINE, CLEAN CATCH Performed at [facility], [address] [place], [place] [address]    Special Requests   Final    NONE Performed at [facility], [address] [place], [place] [address]    Culture (A)  Final    90,000 COLONIES/mL ESCHERICHIA COLI Confirmed Extended Spectrum Beta-Lactamase Producer (ESBL).  In bloodstream infections from ESBL organisms, carbapenems are preferred over piperacillin/tazobactam. They are shown to have a lower risk of mortality.    Report Status 09/14/2023 FINAL  Final   Organism ID, Bacteria ESCHERICHIA COLI (A)  Final      Susceptibility   Escherichia coli - MIC*    AMPICILLIN >=32 RESISTANT Resistant     CEFAZOLIN >=64 RESISTANT Resistant     CEFEPIME 0.5 SENSITIVE Sensitive  CEFTRIAXONE >=64 RESISTANT Resistant     CIPROFLOXACIN <=0.25 SENSITIVE Sensitive     GENTAMICIN <=1 SENSITIVE Sensitive     IMIPENEM <=0.25 SENSITIVE Sensitive      NITROFURANTOIN <=16 SENSITIVE Sensitive     TRIMETH/SULFA >=320 RESISTANT Resistant     AMPICILLIN/SULBACTAM 16 INTERMEDIATE Intermediate     PIP/TAZO <=4 SENSITIVE Sensitive ug/mL    * 90,000 COLONIES/mL ESCHERICHIA COLI  Blood culture (routine x 2)     Status: None (Preliminary result)   Collection Time: 09/12/23  3:55 PM   Specimen: BLOOD  Result Value Ref Range Status   Specimen Description BLOOD RIGHT ANTECUBITAL  Final   Special Requests   Final    BOTTLES DRAWN AEROBIC AND ANAEROBIC Blood Culture results may not be optimal due to an inadequate volume of blood received in culture bottles   Culture   Final    NO GROWTH 2 DAYS Performed at [facility], [address], [place], [place] [address]    Report Status PENDING  Incomplete  Blood culture (routine x 2)     Status: None (Preliminary result)   Collection Time: 09/12/23  3:55 PM   Specimen: BLOOD  Result Value Ref Range Status   Specimen Description BLOOD LEFT ANTECUBITAL  Final   Special Requests   Final    BOTTLES DRAWN AEROBIC AND ANAEROBIC BACTEROIDES CACCAE   Culture   Final    NO GROWTH 2 DAYS Performed at [facility], [address], [place], [place] [address]    Report Status PENDING  Incomplete    Labs: CBC: Recent Labs  Lab 09/12/23 1220 09/13/23 0430  WBC 10.3 10.4  HGB 14.7 12.8  HCT 45.5 39.5  MCV 88.2 88.2  PLT 331 261   Basic Metabolic Panel: Recent Labs  Lab 09/12/23 1220 09/13/23 0430  NA 136 137  K 4.0 3.9  CL 99 102  CO2 25 27  GLUCOSE 107* 115*  BUN 17 12  CREATININE 0.56 0.59  CALCIUM 9.7 8.8*   Liver Function Tests: Recent Labs  Lab 09/12/23 1220  AST 20  ALT 25  ALKPHOS 102  BILITOT 1.0  PROT 7.6  ALBUMIN 4.0   CBG: No results for input(s): GLUCAP in the last 168 hours.  Discharge time spent: greater than 30 minutes.  Signed: Donaciano Frizzle, MD [facility] 09/14/2023

## 2023-09-14 NOTE — Plan of Care (Signed)
  Problem: Health Behavior/Discharge Planning: Goal: Ability to manage health-related needs will improve Outcome: Progressing   Problem: Activity: Goal: Risk for activity intolerance will decrease Outcome: Progressing   Problem: Nutrition: Goal: Adequate nutrition will be maintained Outcome: Progressing   Problem: Safety: Goal: Ability to remain free from injury will improve Outcome: Progressing

## 2023-09-14 NOTE — Plan of Care (Signed)

## 2023-09-17 LAB — CULTURE, BLOOD (ROUTINE X 2)
Culture: NO GROWTH
Culture: NO GROWTH
